# Patient Record
Sex: Male | Born: 1965 | Race: White | Hispanic: No | Marital: Single | Smoking: Current every day smoker
Health system: Southern US, Community
[De-identification: ages and names within clinical notes are randomized; demographics above are authoritative.]

## PROBLEM LIST (undated history)

## (undated) DIAGNOSIS — B1920 Unspecified viral hepatitis C without hepatic coma: Secondary | ICD-10-CM

## (undated) DIAGNOSIS — F319 Bipolar disorder, unspecified: Secondary | ICD-10-CM

## (undated) DIAGNOSIS — F329 Major depressive disorder, single episode, unspecified: Secondary | ICD-10-CM

## (undated) DIAGNOSIS — K859 Acute pancreatitis without necrosis or infection, unspecified: Secondary | ICD-10-CM

## (undated) DIAGNOSIS — F32A Depression, unspecified: Secondary | ICD-10-CM

## (undated) DIAGNOSIS — F419 Anxiety disorder, unspecified: Secondary | ICD-10-CM

## (undated) HISTORY — DX: Anxiety disorder, unspecified: F41.9

## (undated) HISTORY — DX: Major depressive disorder, single episode, unspecified: F32.9

## (undated) HISTORY — DX: Depression, unspecified: F32.A

---

## 2006-11-03 ENCOUNTER — Emergency Department (HOSPITAL_COMMUNITY): Admission: EM | Admit: 2006-11-03 | Discharge: 2006-11-03 | Payer: Self-pay | Admitting: Emergency Medicine

## 2007-06-29 ENCOUNTER — Ambulatory Visit (HOSPITAL_COMMUNITY): Admission: RE | Admit: 2007-06-29 | Discharge: 2007-06-29 | Payer: Self-pay | Admitting: Family Medicine

## 2007-06-30 ENCOUNTER — Inpatient Hospital Stay (HOSPITAL_COMMUNITY): Admission: EM | Admit: 2007-06-30 | Discharge: 2007-07-03 | Payer: Self-pay | Admitting: Emergency Medicine

## 2011-01-22 NOTE — H&P (Signed)
NAME:  Luke Harper, Luke Harper              ACCOUNT NO.:  [id number]   MEDICAL RECORD NO.:  [id number]          PATIENT TYPE:  INP   LOCATION:  5735                         FACILITY:  [facility]   PHYSICIAN:  Thomas A. Cornett, M.D.DATE OF BIRTH:  03-Feb-1966   DATE OF ADMISSION:  06/30/2007  DATE OF DISCHARGE:                              HISTORY & PHYSICAL   FAMILY DOCTOR:  Dr. Tally Joe   SURGEON:  Dr. Luisa Hart   CHIEF COMPLAINT:  Pain in right buttock.   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  Since then, the pain has worsened in the  past 24 hours, has begun to swell and worsen even more.  He had an MRI  yesterday which showed no abscess, however his right buttock has gotten  bigger and continuously gotten worse today.  The patient's thermometer  is not accurate at home; therefore, he has not taken his temperature.  However, he does state that he has had the sweats for the past couple  days.  His white count has increased today to 22,900.  At the doctor's  office yesterday, he got one shot of Rocephin and then today he has  received another shot of Rocephin.  At that time, he was sent over to  [facility] for [place] to see.   PAST HISTORY:  He had a knee arthroscopy 24+ years ago.   FAMILY HISTORY:  Noncontributory.   SOCIAL HISTORY:  He smokes one pack of cigarettes a day and  approximately one pint of alcohol each week.   DRUG ALLERGIES:  NKDA.   MEDICATIONS:  None.   REVIEW OF SYSTEMS:  Pain in the right buttock.  See HPI for other  details, otherwise his review of systems is negative.   PHYSICAL EXAMINATION:  GENERAL:  This is a 45 year old white male who is  febrile with some sweats and is in mild distress.  VITAL SIGNS:  Temperature 102.7, pulse 140 beats per minute,  respirations 18 and blood pressure is 147/94.  HEENT:  Normocephalic, atraumatic.   Sclerae non-injected.  NECK:  Supple.  No lymphadenopathy.  CHEST:  Clear to auscultation bilaterally.  HEART:  Regular rate and rhythm.  No murmurs, gallops, rubs.  ABDOMEN:  Benign with positive bowel sounds and nontender.  EXTREMITIES:  Right buttock with edema, increased fluctuance on the  lateral aspect of the right buttock, no redness is present, however  there is a diffuse amount of warmth noted on the right buttock, no  definite drainage point is noted either.  SKIN:  Normal with no rashes, sores noted.  NEUROLOGIC:  Normal sensation with 2+ strength in all four extremities  with a normal gait.   LABS:  White blood cells 22,900 with a left shift and neutrophils of  81%.  Hemoglobin is 17.3, hematocrit is 49.6 with platelet count of  269,000.  At this point, a BMP is pending.   IMPRESSION:  1. Gluteal abscess of right buttock.  2.  Fever.  3. Significant leukocytosis.   PLAN:  1. Admit to [facility] medical doctor for possible      incision and drainage.  2. Blood cultures to be done before antibiotics are started due to his      increase in fever and white blood count over the past day.  3. NPO.  4. IV pain medicines along with Toradol to help control his fever.  5. Any further recommendations per Dr. Luisa Hart once he comes in and      sees the patient.      Letha Cape, PA      Maisie Fus A. Cornett, M.D.  Electronically Signed    KEO/MEDQ  D:  06/30/2007  T:  07/01/2007  Job:  [id number]

## 2011-01-22 NOTE — Op Note (Signed)
NAME:  Luke Harper, Luke Harper              ACCOUNT NO.:  [id number]   MEDICAL RECORD NO.:  [id number]          PATIENT TYPE:  INP   LOCATION:  5735                         FACILITY:  [facility]   PHYSICIAN:  Adolph Pollack, M.D.DATE OF BIRTH:  Jul 24, 1966   DATE OF PROCEDURE:  06/30/2007  DATE OF DISCHARGE:                               OPERATIVE REPORT   PREOPERATIVE DIAGNOSIS:  Right gluteal abscess.   POSTOPERATIVE DIAGNOSIS:  Right gluteal abscess.   PROCEDURE:  Complex incision and drainage, right gluteal abscess.   SURGEON:  Adolph Pollack, M.D.   ANESTHESIA:  General.   INDICATION:  Mr. Luke Harper is a 45 year old male who apparently rolled over  on a tool last Thursday and felt a pain in his right buttock.  Since  that time, the pain has progressively worsened.  He sought attention  yesterday and has he had noted some swelling.  He was given a shot of  Rocephin by his primary care physician and an ultrasound demonstrated a  small fluid collection thought to be hematoma.  However, he began having  increasing fever with increasing size of the swollen area, feeling worse  and having sweats.  He was sent for CT scan.  This apparently showed a  right gluteal abscess and now he has been sent to the emergency  department for evaluation and is now brought to the operating room for  incision and drainage.   Both Dr. Luisa Hart and I have seen the patient and discussed the procedure  and risks with him extensively.  Risks include but not limited to  bleeding, anesthesia, residual infection, need for extensive procedures  including a very large open wound, possibly flap closure eventually or  skin grafting.   TECHNIQUE:  He was seen in the holding area and the right buttock area  marked my initials.  Was then brought to the operating room and while  supine on the stretcher given a general anesthetic.  He was then placed  prone on the operating table with appropriate padding performed.  The  buttocks area were sterilely prepped and draped.  An examination  demonstrated no fluctuant area but firm induration medially, laterally  and centrally.  Using a 19 gauge needle, I initially started medially  near the gluteal cleft and inserted needle with aspiration and no  purulent material was noted.  I likewise did this laterally.  I then  went centrally, inserted the needle centrally in the mid buttock and got  10 mL of purulent material.  I could feel the induration medially toward  the cleft and as I milked this more purulent material came out.  I then  took a knife and made an incision around the area of the needle.  I  removed and the syringe and needle and used a Kelly clamp to enter the  abscess cavity and placed to suction and evacuated some more purulent  material.  I then performed a finger exam and there was a fairly well  defined the cavity going all the way toward the gluteal cleft medially  about 12 cm.  I could not find  any other obvious cavities at this time.  I sent the fluid for Gram stain and culture.   Following this I took a 1/2 inch Penrose drain and inserted it all the  way into the cavity 12 cm.  I then did took a nylon suture, sewed it to  the skin and sewed a safety pin with the suture.  The safety pin was  then placed through the drain and drained cut off.  A bulky dressing was  applied.   He tolerated the procedure without apparent complications and was taken  to recovery in satisfactory condition.  The plan will be to slowly inch  the drain out over time as long as this controlled the infection.  He  will be placed on IV antibiotics.  If this does not control the  infection and he will need reoperation.  He is well aware of this.      Adolph Pollack, M.D.  Electronically Signed     TJR/MEDQ  D:  06/30/2007  T:  07/01/2007  Job:  [id number]   cc:   Tally Joe, M.D.

## 2011-01-22 NOTE — Discharge Summary (Signed)
NAME:  Luke Harper, JEFFREY              ACCOUNT NO.:  [id number]   MEDICAL RECORD NO.:  [id number]          PATIENT TYPE:  INP   LOCATION:  5736                         FACILITY:  [facility]   PHYSICIAN:  Thomas A. Cornett, M.D.DATE OF BIRTH:  17-Jan-1966   DATE OF ADMISSION:  06/30/2007  DATE OF DISCHARGE:  07/03/2007                               DISCHARGE SUMMARY   DISCHARGING PHYSICIAN:  Dr. Luisa Hart.   OPERATING PHYSICIAN:  Dr. Abbey Chatters.   PRIMARY CARE PHYSICIAN:  Dr. Tally Joe.   CHIEF COMPLAINT/REASON FOR ADMISSION:  Mr. Luke Harper is a 45 year old male  patient who works as a Nutritional therapist.  The Thursday prior to admission the  patient was at work and rolled over on a tool and felt an instant  piercing sensation in his buttock.  Since that time the patient has had  increasing pain and swelling in the right gluteal area.  He presented to  his primary care physician 24 hours prior to admission and was felt to  have a developing abscess.  He was treated with IM Rocephin.  He had an  elevated white count at that time of around 18,000.  He had worsening  pain over the 24 hours since then.  Went back to see  Dr. Azucena Cecil.  Received another injection of Rocephin.  He had had prior  imaging of that area which showed no definite abscess, just edematous  process.  Because of increasing pain and white count the patient was  sent to [facility] ER for surgical evaluation.   On admission the patient was quite febrile, temperature 102.7, and  tachycardic with a heart rate of 140.  On exam his right buttock was  extremely edematous with induration near the perirectal portion of this.  The edema actually spread throughout the entire buttock region becoming  more fluctuant as it tracks laterally towards the right lateral hip.  Again  this area is exquisitely tender.  There are no obvious areas of  redness or drainage.  There is a tiny bruise-like area in the buttock  region.  This process does not involve the  rectum or the perirectal  region directly.  The patient was admitted with following diagnoses:   DIAGNOSES:  1. Gluteal abscess right buttock.  Rule out necrotizing process.  2. Fever and leukocytosis.   HOSPITAL COURSE:  The patient was admitted prior to receiving  antibiotics.  Blood cultures were obtained.  He was started empirically  on Unasyn and vancomycin.  He was subsequently taken from the ER to the  OR by Dr. Abbey Chatters where he underwent an ultrasound-guided drainage of  a deep gluteal abscess tract that tracked down towards the coccyx bone.  Please review Dr. Maris Berger operative note for specific details.  The  wound was left open with a wide Penrose drain in place deep into the  wound tract and sutured into place.   Over the next several days the patient continued to improve.  Initially  he did not have much drainage out of the wound, and by postop day #2 the  patient was having a significant amount of thick reddish  brown nonfoul-  smelling drainage per the wound, but, otherwise, the wound had improved  greatly.  Marked decrease in edema, induration, and fluctuance, and by  the date of discharge essentially nontender.  By postop day #2 the  patient's white count had decreased to 11,500, and he was essentially  afebrile.  He was spiking some low grade temperatures of 100.9 24 hours  prior to discharge.   Subsequent wound cultures were positive for staph aureus resistant to  penicillin and Septra and sensitive to tetracycline and Levaquin.  The  patient was subsequently discharged home on doxycycline for seven days  with plans to follow up with Dr. Abbey Chatters, the operative physician, on  Monday, October 27.  The Penrose drain was left in place because of  significant drain output, and patient has been instructed on how to  shower t.i.d. and reinforce dressings around the drain to minimize  soilage of clothing.   DISCHARGE DIAGNOSES:  1. Deep tracking right gluteal  abscess without evidence of necrotizing      process.  2. Status post incision and drainage of right gluteal abscess.  3. Cultures positive for Staphylococcus aureus resistant to penicillin      and Septra.   DISCHARGE MEDICATIONS:  The patient will resume the following home  medication:   1. Cymbalta 60 mg daily.  2. New medications include Percocet 5/325 1-2 tabs every four hours as      needed for pain, over-the-counter ibuprofen 2-3 tabs every eight      hours as needed for pain in addition to Percocet.  Please take with      food.  Doxycycline 100 mg b.i.d. for seven days.  3. The patient was also complaining of obstipation issues before      discharge.  Therefore he was given Dulcolax tabs two as well as a      dose of MiraLax before discharge, and he was also started on these      over-the-counter medications:  MiraLax 17 g in 8 oz of fluid daily      while constipated, over-the-counter docusate sodium one twice daily      for constipation.   Return to work to be determined by Dr. Abbey Chatters.  I have given the  patient a note that keeps him out of work for at least two weeks at this  point.   DIET:  No restrictions.   WOUND CARE:  Shower three times daily, dry packing or adult diaper to  manage wound drainage.   ACTIVITY:  Increase activity slowly.  May walk up steps, shower as  noted, no driving while taking the Percocet.   FOLLOW-UP APPOINTMENTS:  He has an appointment to see Dr. Abbey Chatters at  9:15 on Monday, October 27.      Allison L. Rennis Harding, N.P.      Thomas A. Cornett, M.D.  Electronically Signed    ALE/MEDQ  D:  07/03/2007  T:  07/04/2007  Job:  [id number]   cc:   Adolph Pollack, M.D.  Tally Joe, M.D.

## 2011-04-04 ENCOUNTER — Other Ambulatory Visit: Payer: Self-pay | Admitting: Gastroenterology

## 2011-04-04 ENCOUNTER — Ambulatory Visit (INDEPENDENT_AMBULATORY_CARE_PROVIDER_SITE_OTHER): Payer: 59 | Admitting: Gastroenterology

## 2011-04-04 VITALS — BP 99/72 | HR 61 | Temp 97.4°F | Ht 69.0 in | Wt 209.0 lb

## 2011-04-04 DIAGNOSIS — B182 Chronic viral hepatitis C: Secondary | ICD-10-CM

## 2011-04-05 LAB — HEPATITIS A ANTIBODY, TOTAL: Hep A Total Ab: NEGATIVE

## 2011-04-05 LAB — COMPREHENSIVE METABOLIC PANEL
Albumin: 4.1 g/dL (ref 3.5–5.2)
Alkaline Phosphatase: 82 U/L (ref 39–117)
BUN: 16 mg/dL (ref 6–23)
Calcium: 9.4 mg/dL (ref 8.4–10.5)
Chloride: 101 mEq/L (ref 96–112)
Potassium: 4.6 mEq/L (ref 3.5–5.3)
Sodium: 141 mEq/L (ref 135–145)
Total Bilirubin: 0.4 mg/dL (ref 0.3–1.2)

## 2011-04-05 LAB — CBC WITH DIFFERENTIAL/PLATELET
Basophils Absolute: 0 10*3/uL (ref 0.0–0.1)
Eosinophils Absolute: 0.1 10*3/uL (ref 0.0–0.7)
Eosinophils Relative: 2 % (ref 0–5)
Hemoglobin: 16.1 g/dL (ref 13.0–17.0)
Lymphs Abs: 3.3 10*3/uL (ref 0.7–4.0)
Monocytes Relative: 9 % (ref 3–12)
Neutrophils Relative %: 50 % (ref 43–77)
Platelets: 258 10*3/uL (ref 150–400)
RDW: 13.7 % (ref 11.5–15.5)

## 2011-04-05 LAB — IRON AND TIBC
Iron: 120 ug/dL (ref 42–165)
UIBC: 162 ug/dL

## 2011-04-05 LAB — HEPATITIS B CORE ANTIBODY, TOTAL: Hep B Core Total Ab: NEGATIVE

## 2011-04-05 LAB — TSH: TSH: 2.041 u[IU]/mL (ref 0.350–4.500)

## 2011-04-05 LAB — FERRITIN: Ferritin: 769 ng/mL — ABNORMAL HIGH (ref 22–322)

## 2011-04-11 NOTE — Progress Notes (Addendum)
NAME:  Luke Harper, Luke Harper    MR#:  [id number]      DATE:  04/04/2011  DOB:  [birth year]    cc: Consulting Physician:  Eugenio Hoes. Book, MD, [address] [place], [place], [address], Phone [phone], Fax [phone] Primary care physician:  Same Referring Physician:  Elias Else, MD, [facility], [address], [place], [place] [address], Fax [phone]    Reason for referral:  Positive hepatitis C antibody.   HISTORY OF PRESENT ILLNESS:  The patient is a 45 year old gentleman who I have been asked to see in consultation by Dr. Nicholos Johns regarding a positive hepatitis C antibody.  According to his wife, in 2011 his liver enzymes were normal. In fact on 02/28/2010, his ALT was 40, which although by lab criteria was normal, it is in fact near the upper limits of normal. The patient  reports undergoing a routine physical at his place of employment, which the [employer] and being told he had abnormal liver tests. He was sent to Dr. Nicholos Johns to follow up on this. On 12/11/2010,  his ALT was 281 with an AST of 142 and ALP of 96. Total bilirubin 0.6 and the albumin was 3.9. This led to the finding of a positive hepatitis C antibody on 12/11/2010. His hepatitis B surface antigen  was negative. His HCV RNA on 01/15/2011, was 750,860 international units per mL.  He was not genotyped.  There is no history to suggest decompensated or cryoglobulin mediated disease.  With respect to risk factors for liver disease, he drank a fifth a week until approximately 8 months ago when he stopped. There is no history of DWIs. 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 He is attending counseling through [facility] in [place] once a week for the last 5 months  and sees a psychiatrist, Dr. Carver Fila, every 3 months. He has tattoos but no history of unsterile body piercing or blood transfusions.   FAMILY HISTORY:  Significant  for father who had alcohol-induced cirrhosis. He believes he has been vaccinated hepatitis B at work but not hepatitis A.   PAST MEDICAL HISTORY:  Otherwise unremarkable.    Past surgical history:  He developed right buttock staph infection requiring drainage. Twenty years ago he had surgery for right knee.    Past psychiatric history:  Six years ago he was hospitalized for depression but there is no history of suicide attempt. He see a psychiatrist Dr. Carver Fila every 3 months. In addition to being seen in counseling for his drug use  through [facility]. He is managed with antidepressants. The patient was on Cymbalta in the past but this was discontinued because of his liver disease.   CURRENT MEDICATIONS:  Wellbutrin 150 mg p.o. b.i.d., sertraline mg p.o. daily, Suboxone 8 mg p.o. daily.    Allergies:  Denies.    Habits:  Smoking, 1-2 pack cigarettes per day. Alcohol as above.   FAMILY HISTORY:  As above.   SOCIAL HISTORY:  Married and accompanied by his wife today. He has one 7 year old daughter. He works for the [employer]. This  primarily means working Monday through Friday with weekends on-call but rarely called.   REVIEW OF SYSTEMS:  All 10 systems reviewed today on the review of systems form, which was signed and placed in the chart. His CES-D was 32.   PHYSICAL EXAMINATION:  Constitutional: Well appearing without stigmata of chronic  liver disease or peripheral wasting. Vital signs: Height 69 inches, weight 209 pounds, blood pressure 99/72, pulse is 61, temperature 97.4  Fahrenheit. Ears, nose, mouth and throat:  Unremarkable oropharynx.  No thyromegaly or neck masses.  Chest:  Resonant to percussion.  Clear to auscultation.  Cardiovascular:  Heart sounds normal S1, S2 without  murmurs or rubs.  There is no peripheral edema.  Abdominal:  Normal bowel sounds.  No masses or tenderness.  I could not appreciate a liver edge or spleen tip.  I  could not appreciate any hernias.   Lymphatics:  No cervical or inguinal lymphadenopathy.  Central Nervous System:  No asterixis or focal neurologic findings.  Dermatologic:  Anicteric without palmar erythema or spider angiomata.  Eyes:  Anicteric sclerae.  Pupils are equal and reactive to light.   LABORATORY STUDIES:  From previous as mentioned above.   ASSESSMENT:  The patient is a 45 year old gentleman with history of a positive HCV RNA and C antibody. He appears to be clinically and biochemically well compensated. My greatest concern is that of his history of depression.  I would like to have the opinion of his therapist through [facility] and his psychiatrist, Dr. Zenon Mayo, opinion regarding his fitness to tolerate therapy with interferon in addition to his compliance with follow up.  I discussed the nature and natural history of hepatitis C with the patient and his wife. We discussed the significance of genotyping him. We discussed the role of biopsy in genotype 1 patients. I discussed  treatment with pegylated interferon, ribavirin for all genotypes and protease inhibitors in addition to pegylated interferon for genotype 1 patients. I reviewed the specific system, constitutional, and  psychiatric side effects of therapy, emphasizing the psychiatric side effects of therapy because of his history. I reviewed the response rates and our treatment protocol for our clinic. Given the fact he has  a history of depression, I asked if he would discuss treatment with his therapist and psychiatrist regarding his fitness to tolerate therapy and his compliance with treatment. I have asked him to tell his mental health providers to communicate with me. We also discussed the risk of contagion.  I discussed the possibility of participating in clinical trials, although his psychiatric history may preclude him from doing so. I explained to him this is not considered standard of care, and the    complete risks and benefits are not known. He is willing to take a phone call from our research coordinator should he be eligible.   Plan:  1. Genotype. 2. Standard labs otherwise. 3. I would like, by way of this note, to hear from his therapist and Dr. Carver Fila regarding his fitness to tolerate therapy from a psychiatric standpoint, in addition to his compliance with follow up with his appointments at [facility]. Any documentation can be faxed to [phone]. 4. If genotype 1, will biopsy him in anticipation of trying to treat him and then follow up thereafter in clinic. 5. If genotype 2 or 3, follow up directly without biopsy. 6. I will pass on his information to our research coordinators to see if he is eligible for any protocols. 7. Literature on hepatitis C given to the patient and his wife, in addition to web sites relevant to hepatitis C.             Brooke Dare, MD    ADDENDUM:  Genotype 1a.  Will ask for biopsy.  Hep A and B nave.  [id number] .[id number]  D:  Thu Jul 26 19:24:36 2012 ; T:  Thu Jul 26 21:12:28 2012  Job #:  [id number]

## 2011-06-19 LAB — CBC
HCT: 40.7
Hemoglobin: 13.7
MCV: 94.9
Platelets: 245
RBC: 4.22
RBC: 4.31
RDW: 15.7 — ABNORMAL HIGH

## 2011-06-19 LAB — BASIC METABOLIC PANEL
BUN: 9
CO2: 29
Chloride: 103
Chloride: 95 — ABNORMAL LOW
Creatinine, Ser: 0.93
Creatinine, Ser: 1.09
GFR calc Af Amer: >60
GFR calc Af Amer: >60
GFR calc non Af Amer: >60
Glucose, Bld: 108 — ABNORMAL HIGH
Glucose, Bld: 119 — ABNORMAL HIGH
Potassium: 3.5
Potassium: 4.1
Sodium: 132 — ABNORMAL LOW
Sodium: 137

## 2011-06-19 LAB — ANAEROBIC CULTURE

## 2011-06-19 LAB — CULTURE, ROUTINE-ABSCESS

## 2011-06-19 LAB — CULTURE, BLOOD (ROUTINE X 2)

## 2011-11-21 ENCOUNTER — Telehealth: Payer: Self-pay | Admitting: Gastroenterology

## 2011-11-21 NOTE — Telephone Encounter (Signed)
Luke Harper called to ask about the status of his HCV therapy. 1) I do not see that the biopsy I ordered was ever done 2) I never heard from Dr Carver Fila, though  I copied him on the note  I called and left a message on his answering machine that I never heard from Dr Carver Fila and that he can call back to the office.

## 2011-11-25 ENCOUNTER — Telehealth: Payer: Self-pay

## 2011-12-05 ENCOUNTER — Telehealth: Payer: Self-pay

## 2012-05-10 ENCOUNTER — Ambulatory Visit (INDEPENDENT_AMBULATORY_CARE_PROVIDER_SITE_OTHER): Payer: 59 | Admitting: Emergency Medicine

## 2012-05-10 VITALS — BP 110/72 | HR 66 | Temp 98.4°F | Resp 18 | Ht 70.5 in | Wt 207.0 lb

## 2012-05-10 DIAGNOSIS — S335XXA Sprain of ligaments of lumbar spine, initial encounter: Secondary | ICD-10-CM

## 2012-05-10 MED ORDER — CYCLOBENZAPRINE HCL 10 MG PO TABS: 10.0000 mg | ORAL_TABLET | Freq: Three times a day (TID) | ORAL | Status: AC | PRN

## 2012-05-10 MED ORDER — NAPROXEN SODIUM 550 MG PO TABS: 550.0000 mg | ORAL_TABLET | Freq: Two times a day (BID) | ORAL | Status: DC

## 2012-05-10 NOTE — Progress Notes (Signed)
   Date:  05/10/2012   Name:  Luke Harper   DOB:  [birth year]   MRN:  [id number] Gender: male Age: 46 y.o.  PCP:  No primary provider on file.    Chief Complaint: Back Pain   History of Present Illness:  Luke Harper is a 46 y.o. pleasant patient who presents with the following:  Bent over last night to pick up a pillow on the floor and was unable to get back up.  Experiencing low back pain since. Nonradiating, no neuro symptoms.  Pain located in left lower back.  No improvement since last night.  Worse when he moves about.  History of one prior injury after digging up a water meter.  There is no problem list on file for this patient.   No past medical history on file.  No past surgical history on file.  History  Substance Use Topics  . Smoking status: Current Everyday Smoker -- 2.0 packs/day for 20 years    Types: Cigarettes  . Smokeless tobacco: Not on file  . Alcohol Use: Not on file    No family history on file.  No Known Allergies  Medication list has been reviewed and updated.  No current outpatient prescriptions on file prior to visit.    Review of Systems:  As per HPI, otherwise negative.    Physical Examination: Filed Vitals:   05/10/12 1706  BP: 110/72  Pulse: 66  Temp: 98.4 F (36.9 C)  Resp: 18   Filed Vitals:   05/10/12 1706  Height: 5' 10.5" (1.791 m)  Weight: 207 lb (93.895 kg)   Body mass index is 29.28 kg/(m^2). Ideal Body Weight: Weight in (lb) to have BMI = 25: 176.4    GEN: WDWN, NAD, Non-toxic, Alert & Oriented x 3 HEENT: Atraumatic, Normocephalic.  Ears and Nose: No external deformity. EXTR: No clubbing/cyanosis/edema Back:  Tender left lumbar paravertebral muscles.   NEURO: Normal gait. Neuro grossly intact DTR's symmetric bilaterally PSYCH: Normally interactive. Conversant. Not depressed or anxious appearing.  Calm demeanor.    Assessment and Plan: Lumbar strain Anaprox Flexeril Follow up as needed  Carmelina Dane, MD

## 2012-07-28 ENCOUNTER — Encounter (HOSPITAL_COMMUNITY): Payer: Self-pay | Admitting: Emergency Medicine

## 2012-07-28 ENCOUNTER — Emergency Department (HOSPITAL_COMMUNITY): Payer: 59

## 2012-07-28 ENCOUNTER — Emergency Department (HOSPITAL_COMMUNITY)
Admission: EM | Admit: 2012-07-28 | Discharge: 2012-07-28 | Disposition: A | Discharge status: Home / Self Care | Payer: 59 | Attending: Emergency Medicine | Admitting: Emergency Medicine

## 2012-07-28 DIAGNOSIS — S0003XA Contusion of scalp, initial encounter: Secondary | ICD-10-CM | POA: Insufficient documentation

## 2012-07-28 DIAGNOSIS — Y9389 Activity, other specified: Secondary | ICD-10-CM | POA: Insufficient documentation

## 2012-07-28 DIAGNOSIS — S161XXA Strain of muscle, fascia and tendon at neck level, initial encounter: Secondary | ICD-10-CM

## 2012-07-28 DIAGNOSIS — F17200 Nicotine dependence, unspecified, uncomplicated: Secondary | ICD-10-CM | POA: Insufficient documentation

## 2012-07-28 DIAGNOSIS — Z79899 Other long term (current) drug therapy: Secondary | ICD-10-CM | POA: Insufficient documentation

## 2012-07-28 DIAGNOSIS — S139XXA Sprain of joints and ligaments of unspecified parts of neck, initial encounter: Secondary | ICD-10-CM | POA: Insufficient documentation

## 2012-07-28 DIAGNOSIS — R040 Epistaxis: Secondary | ICD-10-CM | POA: Insufficient documentation

## 2012-07-28 DIAGNOSIS — S0033XA Contusion of nose, initial encounter: Secondary | ICD-10-CM

## 2012-07-28 DIAGNOSIS — S0083XA Contusion of other part of head, initial encounter: Secondary | ICD-10-CM | POA: Insufficient documentation

## 2012-07-28 MED ORDER — CYCLOBENZAPRINE HCL 10 MG PO TABS: 10.0000 mg | ORAL_TABLET | Freq: Three times a day (TID) | ORAL | Status: DC | PRN

## 2012-07-28 MED ORDER — HYDROCODONE-ACETAMINOPHEN 5-325 MG PO TABS: 1.0000 | ORAL_TABLET | Freq: Four times a day (QID) | ORAL | Status: DC | PRN

## 2012-07-28 MED ORDER — IBUPROFEN 800 MG PO TABS: 800.0000 mg | ORAL_TABLET | Freq: Three times a day (TID) | ORAL | Status: DC | PRN
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
## 2012-07-28 NOTE — ED Provider Notes (Signed)
Medical screening examination/treatment/procedure(s) were performed by non-physician practitioner and as supervising physician I was immediately available for consultation/collaboration.   Shuntell Foody, MD 07/28/12 2217 

## 2012-10-01 ENCOUNTER — Other Ambulatory Visit (HOSPITAL_COMMUNITY): Payer: Self-pay | Admitting: Internal Medicine

## 2012-10-01 DIAGNOSIS — B1920 Unspecified viral hepatitis C without hepatic coma: Secondary | ICD-10-CM

## 2012-10-08 ENCOUNTER — Other Ambulatory Visit: Payer: Self-pay | Admitting: Radiology

## 2012-10-14 ENCOUNTER — Other Ambulatory Visit: Payer: Self-pay | Admitting: Radiology

## 2012-10-15 ENCOUNTER — Other Ambulatory Visit: Payer: Self-pay | Admitting: Radiology

## 2012-10-16 ENCOUNTER — Ambulatory Visit (HOSPITAL_COMMUNITY)
Admission: RE | Admit: 2012-10-16 | Discharge: 2012-10-16 | Disposition: A | Discharge status: Home / Self Care | Payer: 59 | Source: Ambulatory Visit | Attending: Internal Medicine | Admitting: Internal Medicine

## 2012-10-16 DIAGNOSIS — B1920 Unspecified viral hepatitis C without hepatic coma: Secondary | ICD-10-CM

## 2012-10-16 DIAGNOSIS — K7689 Other specified diseases of liver: Secondary | ICD-10-CM | POA: Insufficient documentation

## 2012-10-16 LAB — CBC
HCT: 45 % (ref 39.0–52.0)
Hemoglobin: 15.9 g/dL (ref 13.0–17.0)
MCH: 31 pg (ref 26.0–34.0)
MCHC: 35.3 g/dL (ref 30.0–36.0)
MCV: 87.7 fL (ref 78.0–100.0)
Platelets: 207 K/uL (ref 150–400)
RBC: 5.13 MIL/uL (ref 4.22–5.81)
RDW: 13.8 % (ref 11.5–15.5)
WBC: 9.7 K/uL (ref 4.0–10.5)

## 2012-10-16 LAB — PROTIME-INR
INR: 0.87 (ref 0.00–1.49)
Prothrombin Time: 11.8 s (ref 11.6–15.2)

## 2012-10-16 LAB — APTT: aPTT: 31 s (ref 24–37)

## 2012-10-16 MED ORDER — MIDAZOLAM HCL 2 MG/2ML IJ SOLN
INTRAMUSCULAR | Status: AC
  Filled 2012-10-16: qty 4

## 2012-10-16 MED ORDER — FENTANYL CITRATE 0.05 MG/ML IJ SOLN
INTRAMUSCULAR | Status: AC
  Filled 2012-10-16: qty 4

## 2012-10-16 MED ORDER — SODIUM CHLORIDE 0.9 % IV SOLN
Freq: Once | INTRAVENOUS | Status: AC
  Administered 2012-10-16: 10 mL/h via INTRAVENOUS

## 2012-10-16 MED ORDER — FENTANYL CITRATE 0.05 MG/ML IJ SOLN
INTRAMUSCULAR | Status: DC | PRN
  Administered 2012-10-16 (×2): 50 ug via INTRAVENOUS

## 2012-10-16 MED ORDER — MIDAZOLAM HCL 2 MG/2ML IJ SOLN
INTRAMUSCULAR | Status: DC | PRN
  Administered 2012-10-16: 2 mg via INTRAVENOUS

## 2012-10-16 NOTE — ED Notes (Signed)
Notified SS for bed

## 2012-10-16 NOTE — H&P (Signed)
Luke Harper is an 47 y.o. male.   Chief Complaint: "I'm here for a liver biopsy"  HPI: Patient with history of hepatitis C presents today for US guided random liver biopsy.  PMH: depression,  hepatitis C; denies HTN,DM,CAD,COPD, cancer PSH: I&D of right buttock abscess; rt knee surgery No family history on file. Social History:  reports that he has been smoking Cigarettes.  He has a 40 pack-year smoking history. He does not have any smokeless tobacco history on file. His alcohol and drug histories not on file.  Allergies: No Known Allergies  Current outpatient prescriptions:Multiple Vitamin (MULTIVITAMIN WITH MINERALS) TABS, Take 1 tablet by mouth daily., Disp: , Rfl: ;  Vilazodone HCl (VIIBRYD) 40 MG TABS, Take 40 mg by mouth daily., Disp: , Rfl:  Current facility-administered medications:0.9 %  sodium chloride infusion, , Intravenous, Once, Robet Leu, PA;  fentaNYL (SUBLIMAZE) 0.05 MG/ML injection, , , , ;  midazolam (VERSED) 2 MG/2ML injection, , , ,   Results for orders placed in visit on 04/04/11  IRON AND TIBC      Component Value Range   Iron 120  42 - 165 ug/dL   UIBC 109     TIBC 323  215 - 435 ug/dL   %SAT 43  20 - 55 %  CBC WITH DIFFERENTIAL      Component Value Range   WBC 8.5  4.0 - 10.5 K/uL   RBC 5.16  4.22 - 5.81 MIL/uL   Hemoglobin 16.1  13.0 - 17.0 g/dL   HCT 55.7  32.2 - 02.5 %   MCV 90.3  78.0 - 100.0 fL   MCH 31.2  26.0 - 34.0 pg   MCHC 34.5  30.0 - 36.0 g/dL   RDW 42.7  06.2 - 37.6 %   Platelets 258  150 - 400 K/uL   Neutrophils Relative 50  43 - 77 %   Neutro Abs 4.3  1.7 - 7.7 K/uL   Lymphocytes Relative 38  12 - 46 %   Lymphs Abs 3.3  0.7 - 4.0 K/uL   Monocytes Relative 9  3 - 12 %   Monocytes Absolute 0.8  0.1 - 1.0 K/uL   Eosinophils Relative 2  0 - 5 %   Eosinophils Absolute 0.1  0.0 - 0.7 K/uL   Basophils Relative 0  0 - 1 %   Basophils Absolute 0.0  0.0 - 0.1 K/uL   Smear Review Criteria for review not met    PROTIME-INR   Component Value Range   Prothrombin Time 11.8  11.6 - 15.2 seconds   INR 0.83  <1.50  COMPREHENSIVE METABOLIC PANEL      Component Value Range   Sodium 141  135 - 145 mEq/L   Potassium 4.6  3.5 - 5.3 mEq/L   Chloride 101  96 - 112 mEq/L   CO2 29  19 - 32 mEq/L   Glucose, Bld 78  70 - 99 mg/dL   BUN 16  6 - 23 mg/dL   Creat 2.83  1.51 - 7.61 mg/dL   Total Bilirubin 0.4  0.3 - 1.2 mg/dL   Alkaline Phosphatase 82  39 - 117 U/L   AST 44 (*) 0 - 37 U/L   ALT 60 (*) 0 - 53 U/L   Total Protein 6.4  6.0 - 8.3 g/dL   Albumin 4.1  3.5 - 5.2 g/dL   Calcium 9.4  8.4 - 60.7 mg/dL  TSH      Component  Value Range   TSH 2.041  0.350 - 4.500 uIU/mL  FERRITIN      Component Value Range   Ferritin 769 (*) 22 - 322 ng/mL  HEPATITIS B CORE ANTIBODY, TOTAL      Component Value Range   Hep B Core Total Ab NEG  NEGATIVE  HEPATITIS B SURFACE ANTIBODY      Component Value Range   Hep B S Ab NEG  NEGATIVE  HEPATITIS A ANTIBODY, TOTAL      Component Value Range   Hep A Total Ab NEG  NEGATIVE  AFP TUMOR MARKER      Component Value Range   AFP-Tumor Marker 1.7  0.0 - 8.0 ng/mL  HEPATITIS C GENOTYPE      Component Value Range   HCV Genotype 1a     10/16/12 labs pending Review of Systems  Constitutional: Negative for fever and chills.  Respiratory: Positive for cough. Negative for shortness of breath.   Cardiovascular: Negative for chest pain.  Gastrointestinal: Negative for nausea, vomiting and abdominal pain.  Musculoskeletal: Negative for back pain.  Neurological: Negative for headaches.  Endo/Heme/Allergies: Does not bruise/bleed easily.    Blood pressure 113/70, pulse 66, temperature 97.9 F (36.6 C), temperature source Oral, resp. rate 18, height 5\' 10"  (1.778 m), weight 230 lb (104.327 kg), SpO2 98.00%. Physical Exam  Constitutional: He is oriented to person, place, and time. He appears well-developed and well-nourished.  Cardiovascular: Normal rate and regular rhythm.   Respiratory:  Effort normal.       Scatt insp/exp wheezes  GI: Soft. Bowel sounds are normal. There is no tenderness.  Musculoskeletal: Normal range of motion. He exhibits no edema.  Neurological: He is alert and oriented to person, place, and time.     Assessment/Plan Pt with history of hepatitis C. Plan is for US guided random core liver biopsy today. Details/risks of procedure d/w pt/wife with their understanding and consent.  Kandace Elrod,D KEVIN 10/16/2012, 9:55 AM

## 2012-10-16 NOTE — ED Notes (Signed)
Patient denies pain and is resting comfortably.  

## 2012-10-16 NOTE — Procedures (Signed)
Technically successful US guided biopsy of right lobe of the liver.  No immediate complications.  

## 2013-11-07 ENCOUNTER — Emergency Department (HOSPITAL_COMMUNITY)
Admission: EM | Admit: 2013-11-07 | Discharge: 2013-11-07 | Disposition: A | Discharge status: Home / Self Care | Payer: 59 | Attending: Emergency Medicine | Admitting: Emergency Medicine

## 2013-11-07 ENCOUNTER — Encounter (HOSPITAL_COMMUNITY): Payer: Self-pay | Admitting: Emergency Medicine

## 2013-11-07 ENCOUNTER — Emergency Department (HOSPITAL_COMMUNITY): Payer: 59

## 2013-11-07 DIAGNOSIS — IMO0002 Reserved for concepts with insufficient information to code with codable children: Secondary | ICD-10-CM

## 2013-11-07 DIAGNOSIS — Y929 Unspecified place or not applicable: Secondary | ICD-10-CM | POA: Insufficient documentation

## 2013-11-07 DIAGNOSIS — F1110 Opioid abuse, uncomplicated: Secondary | ICD-10-CM | POA: Insufficient documentation

## 2013-11-07 DIAGNOSIS — Y939 Activity, unspecified: Secondary | ICD-10-CM | POA: Insufficient documentation

## 2013-11-07 DIAGNOSIS — F1910 Other psychoactive substance abuse, uncomplicated: Secondary | ICD-10-CM

## 2013-11-07 DIAGNOSIS — S22009A Unspecified fracture of unspecified thoracic vertebra, initial encounter for closed fracture: Secondary | ICD-10-CM | POA: Insufficient documentation

## 2013-11-07 DIAGNOSIS — R079 Chest pain, unspecified: Secondary | ICD-10-CM | POA: Insufficient documentation

## 2013-11-07 DIAGNOSIS — Z79899 Other long term (current) drug therapy: Secondary | ICD-10-CM | POA: Insufficient documentation

## 2013-11-07 DIAGNOSIS — F17200 Nicotine dependence, unspecified, uncomplicated: Secondary | ICD-10-CM | POA: Insufficient documentation

## 2013-11-07 LAB — COMPREHENSIVE METABOLIC PANEL
ALT: 63 U/L — AB (ref 0–53)
AST: 57 U/L — AB (ref 0–37)
Albumin: 4 g/dL (ref 3.5–5.2)
Alkaline Phosphatase: 70 U/L (ref 39–117)
BILIRUBIN TOTAL: 0.5 mg/dL (ref 0.3–1.2)
BUN: 14 mg/dL (ref 6–23)
CHLORIDE: 99 meq/L (ref 96–112)
CO2: 30 meq/L (ref 19–32)
Calcium: 9.7 mg/dL (ref 8.4–10.5)
Creatinine, Ser: 0.93 mg/dL (ref 0.50–1.35)
GFR calc non Af Amer: >90 mL/min (ref >90)
Glucose, Bld: 90 mg/dL (ref 70–99)
Potassium: 4.3 mEq/L (ref 3.7–5.3)
SODIUM: 140 meq/L (ref 137–147)
Total Protein: 7.3 g/dL (ref 6.0–8.3)

## 2013-11-07 LAB — CBC WITH DIFFERENTIAL/PLATELET
BASOS ABS: 0 10*3/uL (ref 0.0–0.1)
Basophils Relative: 0 % (ref 0–1)
Eosinophils Absolute: 0.1 10*3/uL (ref 0.0–0.7)
Eosinophils Relative: 1 % (ref 0–5)
HEMATOCRIT: 50 % (ref 39.0–52.0)
Hemoglobin: 17.6 g/dL — ABNORMAL HIGH (ref 13.0–17.0)
LYMPHS PCT: 14 % (ref 12–46)
Lymphs Abs: 2 10*3/uL (ref 0.7–4.0)
MCH: 31.5 pg (ref 26.0–34.0)
MCHC: 35.2 g/dL (ref 30.0–36.0)
MCV: 89.4 fL (ref 78.0–100.0)
MONO ABS: 1 10*3/uL (ref 0.1–1.0)
Monocytes Relative: 8 % (ref 3–12)
NEUTROS ABS: 10.6 10*3/uL — AB (ref 1.7–7.7)
Neutrophils Relative %: 77 % (ref 43–77)
PLATELETS: 256 10*3/uL (ref 150–400)
RBC: 5.59 MIL/uL (ref 4.22–5.81)
RDW: 12.8 % (ref 11.5–15.5)
WBC: 13.7 10*3/uL — AB (ref 4.0–10.5)

## 2013-11-07 MED ORDER — NALOXONE HCL 0.4 MG/ML IJ SOLN
0.4000 mg | Freq: Once | INTRAMUSCULAR | Status: AC
  Administered 2013-11-07: 0.4 mg via INTRAVENOUS
  Filled 2013-11-07: qty 1

## 2013-11-07 MED ORDER — IOHEXOL 300 MG/ML  SOLN
100.0000 mL | Freq: Once | INTRAMUSCULAR | Status: AC | PRN
Start: 2013-11-07 — End: 2013-11-07
  Administered 2013-11-07: 100 mL via INTRAVENOUS

## 2013-11-07 NOTE — ED Notes (Signed)
Ambulated with patient; slightly unsteady on feet, but able to walk to bathroom, stood to urinate, and ambulated back to bed. Laid down on bed and went back to sleep.
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
## 2013-11-07 NOTE — ED Notes (Signed)
Called CT to notify them that pt now has  IV access.

## 2013-11-07 NOTE — ED Notes (Signed)
[address] [employer] at bedside.

## 2013-11-07 NOTE — ED Notes (Addendum)
Attempted to call sister on two different numbers, no answer. Patient cannot think of anyone else to take him home. [phone]
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
## 2013-11-07 NOTE — ED Notes (Signed)
Able to reach sister; she is on the way.
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
## 2013-11-30 ENCOUNTER — Other Ambulatory Visit: Payer: Self-pay | Admitting: Family Medicine

## 2013-11-30 ENCOUNTER — Other Ambulatory Visit: Payer: Self-pay | Admitting: Sports Medicine

## 2013-11-30 ENCOUNTER — Ambulatory Visit
Admission: RE | Admit: 2013-11-30 | Discharge: 2013-11-30 | Disposition: A | Discharge status: Home / Self Care | Payer: 59 | Source: Ambulatory Visit | Attending: Family Medicine | Admitting: Family Medicine

## 2013-11-30 DIAGNOSIS — R079 Chest pain, unspecified: Secondary | ICD-10-CM

## 2013-11-30 MED ORDER — IOHEXOL 350 MG/ML SOLN
100.0000 mL | Freq: Once | INTRAVENOUS | Status: AC | PRN
  Administered 2013-11-30: 100 mL via INTRAVENOUS

## 2014-01-16 ENCOUNTER — Ambulatory Visit: Payer: 59

## 2014-01-16 ENCOUNTER — Ambulatory Visit (INDEPENDENT_AMBULATORY_CARE_PROVIDER_SITE_OTHER): Payer: 59 | Admitting: Family Medicine

## 2014-01-16 VITALS — BP 124/70 | HR 91 | Temp 98.2°F | Resp 20 | Ht 70.0 in | Wt 208.4 lb

## 2014-01-16 DIAGNOSIS — L02419 Cutaneous abscess of limb, unspecified: Secondary | ICD-10-CM

## 2014-01-16 DIAGNOSIS — F1910 Other psychoactive substance abuse, uncomplicated: Secondary | ICD-10-CM

## 2014-01-16 DIAGNOSIS — L03119 Cellulitis of unspecified part of limb: Secondary | ICD-10-CM

## 2014-01-16 DIAGNOSIS — M25531 Pain in right wrist: Secondary | ICD-10-CM

## 2014-01-16 DIAGNOSIS — M25539 Pain in unspecified wrist: Secondary | ICD-10-CM

## 2014-01-16 LAB — POCT CBC
Granulocyte percent: 73.8 %G (ref 37–80)
HCT, POC: 48.4 % (ref 43.5–53.7)
Hemoglobin: 16.1 g/dL (ref 14.1–18.1)
Lymph, poc: 3.6 — AB (ref 0.6–3.4)
MCH, POC: 31.3 pg — AB (ref 27–31.2)
MCHC: 33.3 g/dL (ref 31.8–35.4)
MCV: 94.1 fL (ref 80–97)
MID (cbc): 1.1 — AB (ref 0–0.9)
MPV: 8.9 fL (ref 0–99.8)
POC Granulocyte: 13.2 — AB (ref 2–6.9)
POC LYMPH PERCENT: 19.9 %L (ref 10–50)
POC MID %: 6.3 %M (ref 0–12)
Platelet Count, POC: 334 10*3/uL (ref 142–424)
RBC: 5.14 M/uL (ref 4.69–6.13)
RDW, POC: 14.8 %
WBC: 17.9 10*3/uL — AB (ref 4.6–10.2)

## 2014-01-16 MED ORDER — DOXYCYCLINE HYCLATE 100 MG PO CAPS: 100.0000 mg | ORAL_CAPSULE | Freq: Two times a day (BID) | ORAL | Status: DC

## 2014-01-16 MED ORDER — KETOROLAC TROMETHAMINE 60 MG/2ML IM SOLN
60.0000 mg | Freq: Once | INTRAMUSCULAR | Status: AC
  Administered 2014-01-16: 60 mg via INTRAMUSCULAR

## 2014-01-16 MED ORDER — CEFTRIAXONE SODIUM 1 G IJ SOLR
1.0000 g | Freq: Once | INTRAMUSCULAR | Status: AC
  Administered 2014-01-16: 1 g via INTRAMUSCULAR

## 2014-01-16 MED ORDER — AMOXICILLIN-POT CLAVULANATE 875-125 MG PO TABS: 1.0000 | ORAL_TABLET | Freq: Two times a day (BID) | ORAL | Status: DC
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
## 2014-01-17 ENCOUNTER — Ambulatory Visit (INDEPENDENT_AMBULATORY_CARE_PROVIDER_SITE_OTHER): Payer: 59 | Admitting: Family Medicine

## 2014-01-17 VITALS — BP 100/60 | HR 82 | Temp 98.0°F | Resp 18 | Ht 70.0 in | Wt 210.0 lb

## 2014-01-17 DIAGNOSIS — F1310 Sedative, hypnotic or anxiolytic abuse, uncomplicated: Secondary | ICD-10-CM

## 2014-01-17 DIAGNOSIS — M25531 Pain in right wrist: Secondary | ICD-10-CM

## 2014-01-17 DIAGNOSIS — L03119 Cellulitis of unspecified part of limb: Secondary | ICD-10-CM

## 2014-01-17 DIAGNOSIS — M25539 Pain in unspecified wrist: Secondary | ICD-10-CM

## 2014-01-17 DIAGNOSIS — L02519 Cutaneous abscess of unspecified hand: Secondary | ICD-10-CM

## 2014-01-17 DIAGNOSIS — F1110 Opioid abuse, uncomplicated: Secondary | ICD-10-CM

## 2014-01-17 LAB — POCT CBC
Granulocyte percent: 76.2 %G (ref 37–80)
HCT, POC: 44.9 % (ref 43.5–53.7)
HEMOGLOBIN: 14.6 g/dL (ref 14.1–18.1)
Lymph, poc: 2.7 (ref 0.6–3.4)
MCH: 30.8 pg (ref 27–31.2)
MCHC: 32.5 g/dL (ref 31.8–35.4)
MCV: 94.7 fL (ref 80–97)
MID (cbc): 1 — AB (ref 0–0.9)
MPV: 8.8 fL (ref 0–99.8)
POC Granulocyte: 11.7 — AB (ref 2–6.9)
POC LYMPH PERCENT: 17.3 %L (ref 10–50)
POC MID %: 6.5 %M (ref 0–12)
Platelet Count, POC: 297 10*3/uL (ref 142–424)
RBC: 4.74 M/uL (ref 4.69–6.13)
RDW, POC: 13.9 %
WBC: 15.4 10*3/uL — AB (ref 4.6–10.2)

## 2014-01-17 MED ORDER — CEFTRIAXONE SODIUM 1 G IJ SOLR
1.0000 g | Freq: Once | INTRAMUSCULAR | Status: AC
  Administered 2014-01-17: 1 g via INTRAMUSCULAR

## 2014-01-17 MED ORDER — CEFTRIAXONE SODIUM 1 G IJ SOLR: 1.0000 g | Freq: Once | INTRAMUSCULAR | Status: DC

## 2014-01-17 NOTE — Patient Instructions (Signed)
Your blood count is still elevated, but minimally improved.  Continue the antibiotics as prescribed, one more injection of rocephin today and recheck with Luke Harper tomorrow between 8 am and 3pm. Try to minimize use of that wrist for now. Return to the clinic or go to the nearest emergency room if any of your symptoms worsen or new symptoms occur.  As discussed, look into options for suboxone and cessation of drug use.  Can discuss with [facility] or Dr. Salvadore Farberorrington and let me know if we can help in referring you if needed.
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
## 2014-01-18 ENCOUNTER — Ambulatory Visit (INDEPENDENT_AMBULATORY_CARE_PROVIDER_SITE_OTHER): Payer: 59 | Admitting: Family Medicine

## 2014-01-18 VITALS — BP 116/72 | HR 71 | Temp 97.0°F | Resp 16 | Ht 70.0 in | Wt 201.6 lb

## 2014-01-18 DIAGNOSIS — M79609 Pain in unspecified limb: Secondary | ICD-10-CM

## 2014-01-18 DIAGNOSIS — L0390 Cellulitis, unspecified: Secondary | ICD-10-CM

## 2014-01-18 DIAGNOSIS — L0291 Cutaneous abscess, unspecified: Secondary | ICD-10-CM

## 2014-01-18 DIAGNOSIS — K219 Gastro-esophageal reflux disease without esophagitis: Secondary | ICD-10-CM

## 2014-01-18 LAB — POCT CBC
Granulocyte percent: 68.7 %G (ref 37–80)
HCT, POC: 46.1 % (ref 43.5–53.7)
Hemoglobin: 15.1 g/dL (ref 14.1–18.1)
Lymph, poc: 2.2 (ref 0.6–3.4)
MCH, POC: 30.7 pg (ref 27–31.2)
MCHC: 32.8 g/dL (ref 31.8–35.4)
MCV: 93.6 fL (ref 80–97)
MID (cbc): 0.7 (ref 0–0.9)
MPV: 8.9 fL (ref 0–99.8)
POC Granulocyte: 6.5 (ref 2–6.9)
POC LYMPH PERCENT: 23.7 %L (ref 10–50)
POC MID %: 7.6 %M (ref 0–12)
Platelet Count, POC: 340 10*3/uL (ref 142–424)
RBC: 4.92 M/uL (ref 4.69–6.13)
RDW, POC: 14.2 %
WBC: 9.4 10*3/uL (ref 4.6–10.2)

## 2014-01-18 MED ORDER — CEFTRIAXONE SODIUM 1 G IJ SOLR
1.0000 g | INTRAMUSCULAR | Status: DC
  Administered 2014-01-18: 1 g via INTRAMUSCULAR

## 2014-01-18 NOTE — Patient Instructions (Signed)
Cellulitis Cellulitis is an infection of the skin and the tissue beneath it. The infected area is usually red and tender. Cellulitis occurs most often in the arms and lower legs.  CAUSES  Cellulitis is caused by bacteria that enter the skin through cracks or cuts in the skin. The most common types of bacteria that cause cellulitis are Staphylococcus and Streptococcus. SYMPTOMS   Redness and warmth.  Swelling.  Tenderness or pain.  Fever. DIAGNOSIS  Your caregiver can usually determine what is wrong based on a physical exam. Blood tests may also be done. TREATMENT  Treatment usually involves taking an antibiotic medicine. HOME CARE INSTRUCTIONS   Take your antibiotics as directed. Finish them even if you start to feel better.  Keep the infected arm or leg elevated to reduce swelling.  Apply a warm cloth to the affected area up to 4 times per day to relieve pain.  Only take over-the-counter or prescription medicines for pain, discomfort, or fever as directed by your caregiver.  Keep all follow-up appointments as directed by your caregiver. SEEK MEDICAL CARE IF:   You notice red streaks coming from the infected area.  Your red area gets larger or turns dark in color.  Your bone or joint underneath the infected area becomes painful after the skin has healed.  Your infection returns in the same area or another area.  You notice a swollen bump in the infected area.  You develop new symptoms. SEEK IMMEDIATE MEDICAL CARE IF:   You have a fever.  You feel very sleepy.  You develop vomiting or diarrhea.  You have a general ill feeling (malaise) with muscle aches and pains. MAKE SURE YOU:   Understand these instructions.  Will watch your condition.  Will get help right away if you are not doing well or get worse. Document Released: 06/05/2005 Document Revised: 02/25/2012 Document Reviewed: 11/11/2011 [employer] Patient Information 2014 [employer].  

## 2014-01-18 NOTE — Progress Notes (Signed)
Subjective:  This chart was scribed for Elvina SidleKurt Arneda Sappington, MD  by Ashley JacobsBrittany Andrews, [facility] Scribe. The patient was seen in room and the patient's care was started at 1:15 PM.   Patient ID: Luke Harper, male    DOB: 05/21/1966, 48 y.o.   MRN: [id number]  01/18/2014  Follow-up   HPI  HPI Comments: Luke Harper is a 48 y.o. male who arrives to the [facility] for a follow up. He was given a shot of antibiotics and he is still taking the antibiotics while at home. Pt states that the pain and swelling is decreasing. Pt last seen two days ago. He has not used anymore drugs in the past two days.   Pt works as an Teacher, musicArborist and a Plumber. He is currently un-employed.  Review of Systems  Skin: Positive for wound.    Past Medical History  Diagnosis Date  . Anxiety   . Depression    No Known Allergies Current Outpatient Prescriptions  Medication Sig Dispense Refill  . amoxicillin-clavulanate (AUGMENTIN) 875-125 MG per tablet Take 1 tablet by mouth 2 (two) times daily.  20 tablet  0  . amphetamine-dextroamphetamine (ADDERALL) 10 MG tablet Take 10 mg by mouth 3 (three) times daily.      . cefTRIAXone (ROCEPHIN) 1 G injection Inject 1 g into the muscle once.  1 each  0  . celecoxib (CELEBREX) 200 MG capsule Take 200 mg by mouth daily.      . clonazePAM (KLONOPIN) 1 MG tablet Take 1 mg by mouth 3 (three) times daily as needed for anxiety.       Marland Kitchen. doxycycline (VIBRAMYCIN) 100 MG capsule Take 1 capsule (100 mg total) by mouth 2 (two) times daily.  20 capsule  0  . lithium carbonate 300 MG capsule Take 600 mg by mouth at bedtime.       . Multiple Vitamin (MULTIVITAMIN WITH MINERALS) TABS Take 1 tablet by mouth daily.      Marland Kitchen. PRISTIQ 100 MG 24 hr tablet Take 100 mg by mouth daily.        No current facility-administered medications for this visit.       Objective:    BP 116/72  Pulse 71  Temp(Src) 97 F (36.1 C) (Oral)  Resp 16  Ht 5\' 10"  (1.778  m)  Wt 201 lb 9.6 oz (91.445 kg)  BMI 28.93 kg/m2  SpO2 97% Physical Exam Results for orders placed in visit on 01/18/14  POCT CBC      Result Value Ref Range   WBC 9.4  4.6 - 10.2 K/uL   Lymph, poc 2.2  0.6 - 3.4   POC LYMPH PERCENT 23.7  10 - 50 %L   MID (cbc) 0.7  0 - 0.9   POC MID % 7.6  0 - 12 %M   POC Granulocyte 6.5  2 - 6.9   Granulocyte percent 68.7  37 - 80 %G   RBC 4.92  4.69 - 6.13 M/uL   Hemoglobin 15.1  14.1 - 18.1 g/dL   HCT, POC 40.946.1  81.143.5 - 53.7 %   MCV 93.6  80 - 97 fL   MCH, POC 30.7  27 - 31.2 pg   MCHC 32.8  31.8 - 35.4 g/dL   RDW, POC 91.414.2     Platelet Count, POC 340  142 - 424 K/uL   MPV 8.9  0 - 99.8 fL  Assessment & Plan:    Cellulitis - Plan: POCT CBC, cefTRIAXone (ROCEPHIN) injection 1 g Continue current antibiotics and no need to return unless redness, swelling, or pain increase. Signed, Elvina SidleKurt Giavonna Pflum, MD

## 2014-01-20 ENCOUNTER — Telehealth: Payer: Self-pay

## 2014-01-20 NOTE — Telephone Encounter (Signed)
PATIENT STATES HE SAW DR. Neva SeatGREENE A FEW DAYS AGO FOR SOMETHING ELSE. HOWEVER, DR. Neva SeatGREENE TOLD HIM TO CALL BACK IF HE WAS INTERESTED IN GETTING A REFERRAL TO SEE DR. KIP CORRINGTON FOR NARCOTIC ABUSE. HE WOULD LIKE DR. GREENE TO GO FORWARD WITH THE REFERRAL FOR HIM. BEST PHONE [phone] (CELL)   MBC

## 2014-01-22 NOTE — Telephone Encounter (Signed)
Pt went ahead and made an appt and saw him on Thurs.

## 2014-01-22 NOTE — Telephone Encounter (Signed)
He should not need a referral as he was a patient of Dr. Salvadore Farberorrington prior - can just call his office for appt or to determine how he can see Dr. Salvadore Farberorrington.

## 2014-07-14 NOTE — Addendum Note (Signed)
Addended by: Ethelda ChickSMITH, Cecia Egge M on: 07/14/2014 08:15 AM   Modules accepted: Level of Service

## 2014-07-14 NOTE — Progress Notes (Signed)
History and physical examinations obtained with Heather Marte, PA-C. Agree with assessment and plan. 

## 2014-08-31 ENCOUNTER — Emergency Department (HOSPITAL_COMMUNITY): Payer: 59

## 2014-08-31 ENCOUNTER — Encounter (HOSPITAL_COMMUNITY): Payer: Self-pay

## 2014-08-31 ENCOUNTER — Emergency Department (HOSPITAL_COMMUNITY)
Admission: EM | Admit: 2014-08-31 | Discharge: 2014-08-31 | Disposition: A | Discharge status: Home / Self Care | Payer: 59 | Attending: Emergency Medicine | Admitting: Emergency Medicine

## 2014-08-31 DIAGNOSIS — Z792 Long term (current) use of antibiotics: Secondary | ICD-10-CM | POA: Diagnosis not present

## 2014-08-31 DIAGNOSIS — R4182 Altered mental status, unspecified: Secondary | ICD-10-CM

## 2014-08-31 DIAGNOSIS — F1310 Sedative, hypnotic or anxiolytic abuse, uncomplicated: Secondary | ICD-10-CM | POA: Insufficient documentation

## 2014-08-31 DIAGNOSIS — F1110 Opioid abuse, uncomplicated: Secondary | ICD-10-CM | POA: Insufficient documentation

## 2014-08-31 DIAGNOSIS — F1510 Other stimulant abuse, uncomplicated: Secondary | ICD-10-CM | POA: Diagnosis not present

## 2014-08-31 DIAGNOSIS — T401X1A Poisoning by heroin, accidental (unintentional), initial encounter: Secondary | ICD-10-CM | POA: Insufficient documentation

## 2014-08-31 DIAGNOSIS — F329 Major depressive disorder, single episode, unspecified: Secondary | ICD-10-CM | POA: Diagnosis not present

## 2014-08-31 DIAGNOSIS — F419 Anxiety disorder, unspecified: Secondary | ICD-10-CM | POA: Insufficient documentation

## 2014-08-31 DIAGNOSIS — Y9389 Activity, other specified: Secondary | ICD-10-CM | POA: Insufficient documentation

## 2014-08-31 DIAGNOSIS — Z720 Tobacco use: Secondary | ICD-10-CM | POA: Diagnosis not present

## 2014-08-31 DIAGNOSIS — F1410 Cocaine abuse, uncomplicated: Secondary | ICD-10-CM | POA: Diagnosis not present

## 2014-08-31 DIAGNOSIS — Y998 Other external cause status: Secondary | ICD-10-CM | POA: Diagnosis not present

## 2014-08-31 DIAGNOSIS — Y9289 Other specified places as the place of occurrence of the external cause: Secondary | ICD-10-CM | POA: Diagnosis not present

## 2014-08-31 DIAGNOSIS — Z791 Long term (current) use of non-steroidal anti-inflammatories (NSAID): Secondary | ICD-10-CM | POA: Diagnosis not present

## 2014-08-31 DIAGNOSIS — F1910 Other psychoactive substance abuse, uncomplicated: Secondary | ICD-10-CM

## 2014-08-31 DIAGNOSIS — Z79899 Other long term (current) drug therapy: Secondary | ICD-10-CM | POA: Diagnosis not present

## 2014-08-31 LAB — CBC
HCT: 46.2 % (ref 39.0–52.0)
Hemoglobin: 15.8 g/dL (ref 13.0–17.0)
MCH: 31.3 pg (ref 26.0–34.0)
MCHC: 34.2 g/dL (ref 30.0–36.0)
MCV: 91.7 fL (ref 78.0–100.0)
PLATELETS: 217 10*3/uL (ref 150–400)
RBC: 5.04 MIL/uL (ref 4.22–5.81)
RDW: 12.8 % (ref 11.5–15.5)
WBC: 15 10*3/uL — AB (ref 4.0–10.5)

## 2014-08-31 LAB — URINALYSIS, ROUTINE W REFLEX MICROSCOPIC
Bilirubin Urine: NEGATIVE
Glucose, UA: NEGATIVE mg/dL
Hgb urine dipstick: NEGATIVE
Ketones, ur: NEGATIVE mg/dL
Leukocytes, UA: NEGATIVE
NITRITE: NEGATIVE
Protein, ur: 100 mg/dL — AB
SPECIFIC GRAVITY, URINE: 1.018 (ref 1.005–1.030)
Urobilinogen, UA: 1 mg/dL (ref 0.0–1.0)
pH: 5 (ref 5.0–8.0)

## 2014-08-31 LAB — URINE MICROSCOPIC-ADD ON

## 2014-08-31 LAB — COMPREHENSIVE METABOLIC PANEL
ALBUMIN: 3.7 g/dL (ref 3.5–5.2)
ALT: 51 U/L (ref 0–53)
AST: 45 U/L — AB (ref 0–37)
Alkaline Phosphatase: 65 U/L (ref 39–117)
Anion gap: 7 (ref 5–15)
BUN: 10 mg/dL (ref 6–23)
CALCIUM: 8.2 mg/dL — AB (ref 8.4–10.5)
CO2: 25 mmol/L (ref 19–32)
CREATININE: 0.98 mg/dL (ref 0.50–1.35)
Chloride: 103 mEq/L (ref 96–112)
GFR calc Af Amer: >90 mL/min (ref >90)
Glucose, Bld: 191 mg/dL — ABNORMAL HIGH (ref 70–99)
Potassium: 3.1 mmol/L — ABNORMAL LOW (ref 3.5–5.1)
Sodium: 135 mmol/L (ref 135–145)
Total Bilirubin: 1 mg/dL (ref 0.3–1.2)
Total Protein: 6.5 g/dL (ref 6.0–8.3)

## 2014-08-31 LAB — RAPID URINE DRUG SCREEN, HOSP PERFORMED
Amphetamines: POSITIVE — AB
Barbiturates: NOT DETECTED
Benzodiazepines: POSITIVE — AB
Cocaine: POSITIVE — AB
Opiates: POSITIVE — AB
Tetrahydrocannabinol: NOT DETECTED

## 2014-08-31 LAB — ACETAMINOPHEN LEVEL

## 2014-08-31 LAB — AMMONIA: AMMONIA: 36 umol/L — AB (ref 11–32)

## 2014-08-31 LAB — SALICYLATE LEVEL

## 2014-08-31 LAB — CBG MONITORING, ED: Glucose-Capillary: 189 mg/dL — ABNORMAL HIGH (ref 70–99)

## 2014-08-31 LAB — ETHANOL

## 2014-08-31 LAB — I-STAT CG4 LACTIC ACID, ED: LACTIC ACID, VENOUS: 2.16 mmol/L (ref 0.5–2.2)

## 2014-08-31 MED ORDER — SODIUM CHLORIDE 0.9 % IV BOLUS (SEPSIS)
1000.0000 mL | Freq: Once | INTRAVENOUS | Status: AC
  Administered 2014-08-31: 1000 mL via INTRAVENOUS

## 2014-08-31 MED ORDER — NALOXONE HCL 1 MG/ML IJ SOLN
2.0000 mg | Freq: Once | INTRAMUSCULAR | Status: AC
  Administered 2014-08-31: 2 mg via INTRAVENOUS

## 2014-08-31 MED ORDER — NALOXONE HCL 1 MG/ML IJ SOLN
INTRAMUSCULAR | Status: AC
  Filled 2014-08-31: qty 2

## 2014-08-31 NOTE — ED Notes (Signed)
Oxygen discontinued. Will monitor pt.

## 2014-08-31 NOTE — ED Notes (Addendum)
Pt alert to self but disoriented to other. Pt reoriented to situation. Pt drowsy and resting at present time. Pt denies SI/HI.
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
## 2014-08-31 NOTE — ED Notes (Signed)
Pt alert to self, aware at hospital, and able to state month/year. Pt reoriented to situation and date.

## 2014-08-31 NOTE — ED Notes (Addendum)
pts wife called back, pt gave verbal agreement that nurse could discuss medical information with wife over the phone. [place], wife is going to call pts mother and see if mother can come pick up pt. Mother will call ED to confirm.

## 2014-08-31 NOTE — ED Notes (Signed)
Patient is resting will walk him when he awakes, breakfast is placed at his bed side

## 2014-08-31 NOTE — ED Notes (Addendum)
Pt still unsteady. Unable to ambulate at this time.

## 2014-08-31 NOTE — ED Notes (Signed)
Pt continues to eat breakfast. Pt given phone to attempt to call friend or family.

## 2014-08-31 NOTE — ED Provider Notes (Signed)
Pt ambulating, speaking and eating without difficulty.  Family now present to take pt home.  Gwyneth SproutWhitney Kaisley Stiverson, MD 08/31/14 1245

## 2014-08-31 NOTE — ED Notes (Addendum)
Pt needs ride home to be discharged, charge asked pt if he wanted to call anyone. He said he didn't have anyone to call. Charge said she would call his emergency contacts, who are wife and mother.neither answered phone, messages left.

## 2014-08-31 NOTE — ED Notes (Signed)
Pt ambulated independently in hall without difficulty. Pt currently eating breakfast at bedside.

## 2014-08-31 NOTE — Discharge Instructions (Signed)
°Emergency Department Resource Guide °1) Find a Doctor and Pay Out of Pocket °Although you won't have to find out who is covered by your insurance plan, it is a good idea to ask around and get recommendations. You will then need to call the office and see if the doctor you have chosen will accept you as a new patient and what types of options they offer for patients who are self-pay. Some doctors offer discounts or will set up payment plans for their patients who do not have insurance, but you will need to ask so you aren't surprised when you get to your appointment. ° °2) Contact Your Local Health Department °Not all health departments have doctors that can see patients for sick visits, but many do, so it is worth a call to see if yours does. If you don't know where your local health department is, you can check in your phone book. The CDC also has a tool to help you locate your state's health department, and many state websites also have listings of all of their local health departments. ° °3) Find a Walk-in Clinic °If your illness is not likely to be very severe or complicated, you may want to try a walk in clinic. These are popping up all over the country in pharmacies, drugstores, and shopping centers. They're usually staffed by nurse practitioners or physician assistants that have been trained to treat common illnesses and complaints. They're usually fairly quick and inexpensive. However, if you have serious medical issues or chronic medical problems, these are probably not your best option. ° °No Primary Care Doctor: °- Call [facility] at  832-8000 - they can help you locate a primary care doctor that  accepts your insurance, provides certain services, etc. °- Physician Referral Service- 1-800-533-3463 ° °Chronic Pain Problems: °Organization         Address  Phone   Notes  °[facility]  (336) 297-2271 Patients need to be referred by their primary care doctor.  ° °Medication  Assistance: °Organization         Address  Phone   Notes  °[place] [facility] [address] °[place], [place] [address] (336) 641-8030 --Must be a resident of [place] °-- Must have NO insurance coverage whatsoever (no Medicaid/ Medicare, etc.) °-- The pt. MUST have a primary care doctor that directs their care regularly and follows them in the community °  °[facility]  (866) 331-1348   °[employer]  (888) 892-1162   ° °Agencies that provide inexpensive medical care: °Organization         Address  Phone   Notes  °[facility]  (336) 832-8035   °[facility]    (336) 832-7272   °[facility] [address] °[place], [place] [address] (336) 832-4777   °[facility] [address], °[place] (336) 271-4999   °[facility]    (336) 373-0678   °[facility]    (336) 272-1050   °[facility] ° [address], [place] Phone:  (336) 832-4444, Fax:  (336) 832-4440 Hours of Operation:  9 am - 6 pm, M-F.  Also accepts Medicaid/Medicare and self-pay.  °[facility] ° [address], [place] Phone: (336) 832-3150, Fax: (336) 832-3151. Hours of Operation:  8:30 am - 5:30 pm, M-F.  Also accepts Medicaid and self-pay.  °[facility] [place] [address], [place] Phone: (336) 878-6027   °[facility] [address], [place], [place] (336)723-1848, Ext. 123 Mondays & Thursdays: 7-9 AM.  First 15 patients are seen on a first come, first serve basis. °  ° °Medicaid-accepting [place] Providers: ° °Organization         Address  Phone   Notes  °[facility] [address], [place] (336) 641-2100 Also accepts self-pay patients.  °[facility] [address], [place] ° (336) 856-9996   °[facility] [address], [place]  (336) 288-8857   °[facility] [address] [place] [address], [place] (336) 299-7000   °Veita Bland [address], [place]  ° (336) 373-1557 Only accepts [place] Access Medicaid patients after they have their name applied to their card.  ° °Self-Pay (no insurance) in [place]: ° °Organization         Address  Phone   Notes  °Sickle Cell Patients, [facility] [address], [place] (336) 832-1970   °[facility] [address], [place] (336) 832-4400   °[facility] ° [address],  (336) 992-4800   °[facility]/Dr. Osei-Bonsu ° [address] [place] [address], [place] or [address], [place] (336) 841-8500 Phone number for both [place] and [place] locations is the same.  °[facility] [address], [place] (336) 299-0000   °[facility] [address] [place] [address], [place] or [address] (336) 852-7530 °(336) 878-2260   °[facility] [address], [place] (336) 350-1642, phone; (336) 294-5005, fax Sees patients 1st and 3rd Saturday of every month.  Must not qualify for public or private insurance (i.e. Medicaid, Medicare, [place] Health Choice, Veterans' Benefits) • Household income should be no more than 200% of the poverty level •The clinic cannot treat you if you are pregnant or think you are pregnant • Sexually transmitted diseases are not treated at the clinic.  ° ° °Dental Care: °Organization         Address  Phone  Notes  °[place] [facility] [address], [place] (336) 641-6152 Accepts children up to age 21 who are enrolled in Medicaid or [place] Health Choice; pregnant women with a Medicaid card; and children who have applied for Medicaid or [place] Health Choice, but were declined, whose parents can pay a reduced fee at time of service.  °[place]  [facility] [place]  [address], [place] (336) 641-7733 Accepts children up to age 21 who are enrolled in Medicaid or [place] Health Choice; pregnant women with a Medicaid card; and children who have applied for Medicaid or [place] Health Choice, but were declined, whose parents can pay a reduced fee at time of service.  °[facility] ° [address], [place] (336) 641-4533 Patients are seen by appointment only. Walk-ins are not accepted. [facility] will see patients 18 years of age and older. °Monday - Tuesday (8am-5pm) °Most Wednesdays (8:30-5pm) °$30 per visit, cash only  °[facility] ° [address], [place] (336) 641-4533 Patients are seen by appointment only. Walk-ins are not accepted. [facility] will see patients 18 years of age and older. °One   Wednesday Evening (Monthly: Volunteer Based).  $30 per visit, cash only  °[facility]  (919) 537-3737 for adults; Children under age 4, call Graduate Pediatric Dentistry at (919) [phone]. Children aged 4-14, please call (919) [phone] to request a pediatric application. ° Dental services are provided in all areas of dental care including fillings, crowns and bridges, complete and partial dentures, implants, gum treatment, root canals, and extractions. Preventive care is also provided. Treatment is provided to both adults and children. °Patients are selected via a lottery and there is often a waiting list. °  °[facility] [address], °[place] ° (336) 763-8833 [email or web address] °  °[facility] [address], [place], [place] (336)723-1848, Ext. 123 Second and Fourth Thursday of each month, opens at 6:30 AM; Clinic ends at 9 AM.  Patients are seen on a first-come first-served basis, and a limited number are seen during each clinic.  ° °[facility] ° [address], [place], [place] (336) 723-7904    Eligibility Requirements °You must have lived in [place], [place], or [place] counties for at least the last three months. °  You cannot be eligible for state or federal sponsored healthcare insurance, including Veterans Administration, Medicaid, or Medicare. °  You generally cannot be eligible for healthcare insurance through your employer.  °  How to apply: °Eligibility screenings are held every Tuesday and Wednesday afternoon from 1:00 pm until 4:00 pm. You do not need an appointment for the interview!  °[facility] [address], [place], [place] 336-631-2330   °[facility]  336-342-8273   °[facility]  336-703-3100   °[facility]  336-570-6415   ° °Behavioral Health Resources in the Community: °Intensive Outpatient Programs °Organization         Address  Phone  Notes  °[facility] [address], [place], [place] 336-878-6098   °[facility] [address], [place], [place] 336-832-9800   °[facility] [address], [place], [place] ° 336-882-2125   °[facility] [address],  °[place], [place] 1-800-853-5163 or 336-641-4981   °Substance Abuse Resources °Organization         Address  Phone  Notes  °[facility]  336-882-2125   °[facility]  336-784-9470   °[facility]  336-285-9073   °[facility]  336-845-3988   °Residential & Outpatient Substance Abuse Program  1-800-659-3381   °Psychological Services °Organization         Address  Phone  Notes  °[facility]  336- 832-9600   °[facility]  336- 378-7881   °[facility] [address], [place] 1-800-853-5163 or 336-641-4981   ° °Mobile Crisis Teams °Organization         Address  Phone  Notes  °[facility], Mobile Crisis Care Unit  1-877-626-1772   °Assertive °[facility] ° [address]  [place], [place] 336-834-9664   °Sharon DeEsch [address] °[place] 336-554-5454   ° °Self-Help/Support Groups °Organization         Address  Phone             Notes  °[facility] [place] - variety of support groups  336- 373-1402 Call for more information  °Narcotics Anonymous (NA), [facility] [address], °[place]  2 meetings at this location  ° °  Residential Treatment Programs °Organization         Address  Phone  Notes  °[facility] [address],    °[place]  1-866-801-8205   °[facility] ° [address], [place], [place] 704-293-8524   °[facility] [address], [place] 336-845-3988 Admissions: 8am-3pm M-F  °[facility] [address]    °[place], [place] 336-841-1104   °[facility] [address], [place], [place] 336-379-7146   °[facility] [address]  °[place], [place] 336-285-9073   °[facility] - Intensive Outpatient [address], [place], [place] 336-852-3033   °[facility] [address]  °[place], [place] 1-877-615-2722 or 336-784-9470   °[facility] [address] [place], [place] 336-227-7417 Accepts Medicaid  °[facility] [address]  °[place] 1-800-659-3381 Substance Abuse/Addiction Treatment  ° °[place] Behavioral Health Resources °Organization         Address  Phone  Notes  °[facility]  (888) 581-9988   °Julie Brannon, PhD [address] [place], [place]   (336) 349-5553 or (336) 951-0000   °[facility]   [address] °[place], [place] (336) 349-4454   °[facility] [address], [place], [place] (336) 342-8316 Insurance/Medicaid/sponsorship through [facility]  °[facility] [address]                                    [place], [place] (336) 342-8316 Therapy/tele-psych/case    °[facility] [address]  ° [place], [place] (336) 349-2233    °Dr. Arfeen  (336) 349-4544   °[facility] [place]  [employer] [place] [facility] 1) [address], [place] °2) [address], [place] °3)  [address], [place] (336) 349-3220 °(336) 342-7768 ° °(336) 342-8140   °[place] [facility] (336) 342-1394 or (336) 342-3537 (After Hours)    ° ° °
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
## 2017-11-08 DIAGNOSIS — S46111A Strain of muscle, fascia and tendon of long head of biceps, right arm, initial encounter: Secondary | ICD-10-CM | POA: Diagnosis not present

## 2017-11-08 DIAGNOSIS — S51051A Open bite, right elbow, initial encounter: Secondary | ICD-10-CM | POA: Diagnosis not present

## 2017-11-08 DIAGNOSIS — S61051A Open bite of right thumb without damage to nail, initial encounter: Secondary | ICD-10-CM | POA: Diagnosis not present

## 2017-11-12 DIAGNOSIS — S61051A Open bite of right thumb without damage to nail, initial encounter: Secondary | ICD-10-CM | POA: Diagnosis not present

## 2017-11-12 DIAGNOSIS — S61250A Open bite of right index finger without damage to nail, initial encounter: Secondary | ICD-10-CM | POA: Diagnosis not present

## 2017-11-12 DIAGNOSIS — S41151A Open bite of right upper arm, initial encounter: Secondary | ICD-10-CM | POA: Diagnosis not present

## 2018-08-01 ENCOUNTER — Emergency Department (HOSPITAL_COMMUNITY): Payer: Self-pay

## 2018-08-01 ENCOUNTER — Other Ambulatory Visit: Payer: Self-pay

## 2018-08-01 ENCOUNTER — Emergency Department (HOSPITAL_COMMUNITY)
Admission: EM | Admit: 2018-08-01 | Discharge: 2018-08-01 | Disposition: A | Discharge status: Home / Self Care | Payer: Self-pay | Attending: Emergency Medicine | Admitting: Emergency Medicine

## 2018-08-01 DIAGNOSIS — Z79899 Other long term (current) drug therapy: Secondary | ICD-10-CM | POA: Insufficient documentation

## 2018-08-01 DIAGNOSIS — R1110 Vomiting, unspecified: Secondary | ICD-10-CM | POA: Insufficient documentation

## 2018-08-01 DIAGNOSIS — T401X1A Poisoning by heroin, accidental (unintentional), initial encounter: Secondary | ICD-10-CM | POA: Insufficient documentation

## 2018-08-01 DIAGNOSIS — F17210 Nicotine dependence, cigarettes, uncomplicated: Secondary | ICD-10-CM | POA: Insufficient documentation

## 2018-08-01 LAB — CBC WITH DIFFERENTIAL/PLATELET
Abs Immature Granulocytes: 0.08 10*3/uL — ABNORMAL HIGH (ref 0.00–0.07)
BASOS PCT: 0 %
Basophils Absolute: 0 10*3/uL (ref 0.0–0.1)
EOS ABS: 0 10*3/uL (ref 0.0–0.5)
EOS PCT: 0 %
HCT: 46.2 % (ref 39.0–52.0)
Hemoglobin: 14.8 g/dL (ref 13.0–17.0)
Immature Granulocytes: 1 %
Lymphocytes Relative: 17 %
Lymphs Abs: 1.5 10*3/uL (ref 0.7–4.0)
MCH: 31.2 pg (ref 26.0–34.0)
MCHC: 32 g/dL (ref 30.0–36.0)
MCV: 97.5 fL (ref 80.0–100.0)
Monocytes Absolute: 0.7 10*3/uL (ref 0.1–1.0)
Monocytes Relative: 8 %
NRBC: 0 % (ref 0.0–0.2)
Neutro Abs: 6.5 10*3/uL (ref 1.7–7.7)
Neutrophils Relative %: 74 %
PLATELETS: 238 10*3/uL (ref 150–400)
RBC: 4.74 MIL/uL (ref 4.22–5.81)
RDW: 13.2 % (ref 11.5–15.5)
WBC: 8.9 10*3/uL (ref 4.0–10.5)

## 2018-08-01 LAB — COMPREHENSIVE METABOLIC PANEL
ALT: 76 U/L — AB (ref 0–44)
AST: 61 U/L — ABNORMAL HIGH (ref 15–41)
Albumin: 4.2 g/dL (ref 3.5–5.0)
Alkaline Phosphatase: 60 U/L (ref 38–126)
Anion gap: 8 (ref 5–15)
BILIRUBIN TOTAL: 1.1 mg/dL (ref 0.3–1.2)
BUN: 16 mg/dL (ref 6–20)
CALCIUM: 8.9 mg/dL (ref 8.9–10.3)
CO2: 29 mmol/L (ref 22–32)
Chloride: 105 mmol/L (ref 98–111)
Creatinine, Ser: 0.98 mg/dL (ref 0.61–1.24)
Glucose, Bld: 148 mg/dL — ABNORMAL HIGH (ref 70–99)
Potassium: 3.6 mmol/L (ref 3.5–5.1)
Sodium: 142 mmol/L (ref 135–145)
TOTAL PROTEIN: 7 g/dL (ref 6.5–8.1)

## 2018-08-01 LAB — RAPID HIV SCREEN (HIV 1/2 AB+AG)
HIV 1/2 ANTIBODIES: NONREACTIVE
HIV-1 P24 ANTIGEN - HIV24: NONREACTIVE

## 2018-08-01 LAB — I-STAT TROPONIN, ED: Troponin i, poc: 0.01 ng/mL (ref 0.00–0.08)

## 2018-08-01 LAB — SALICYLATE LEVEL: Salicylate Lvl: <7 mg/dL (ref 2.8–30.0)

## 2018-08-01 LAB — ACETAMINOPHEN LEVEL: Acetaminophen (Tylenol), Serum: <10 ug/mL — ABNORMAL LOW (ref 10–30)

## 2018-08-01 LAB — CBG MONITORING, ED: GLUCOSE-CAPILLARY: 106 mg/dL — AB (ref 70–99)

## 2018-08-01 LAB — ETHANOL: Alcohol, Ethyl (B): <10 mg/dL (ref <10)

## 2018-08-01 MED ORDER — SODIUM CHLORIDE 0.9 % IV BOLUS
1000.0000 mL | Freq: Once | INTRAVENOUS | Status: AC
  Administered 2018-08-01: 1000 mL via INTRAVENOUS

## 2018-08-01 NOTE — ED Provider Notes (Signed)
Patient seen and examined.  Resting comfortably at this time.  No signs of respiratory compromise.  Awake and alert and stable for discharge   Luke Harper, Luke Cabello, MD 08/01/18 1015

## 2018-08-01 NOTE — ED Provider Notes (Signed)
[facility]-EMERGENCY DEPT Provider Note  CSN: [id number] Arrival date & time: 08/01/18 0510  Chief Complaint(s) No chief complaint on file.  HPI Luke Harper is a 52 y.o. male who came in for heroin overdose.  Patient was found in a home reportedly by bystanders who found the patient unresponsive and apneic.  They initiated bystander CPR. Fire was first on scene noted that the patient was apneic 4-6 times a minute. Fire also noted that there was a box of intranasal Narcan by the patient and was empty.  None of the bystanders admitted to administering Narcan.  Upon EMS arrival patient was being in bagged.  He had several episodes of nonbloody nonbilious emesis.  When he became more responsive, he admitted to heroin use.  Currently patient denies any physical complaints.  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  Denies any other illicit drug use.  HPI  Past Medical History Past Medical History:  Diagnosis Date  . Anxiety   . Depression    There are no active problems to display for this patient.  Home Medication(s) Prior to Admission medications   Medication Sig Start Date End Date Taking? Authorizing Provider  amoxicillin-clavulanate (AUGMENTIN) 875-125 MG per tablet Take 1 tablet by mouth 2 (two) times daily. 01/16/14   Nelva Nay, PA-C  amphetamine-dextroamphetamine (ADDERALL) 10 MG tablet Take 10 mg by mouth 3 (three) times daily.    [provider]  cefTRIAXone (ROCEPHIN) 1 G injection Inject 1 g into the muscle once. 01/17/14   Shade Flood, MD  celecoxib (CELEBREX) 200 MG capsule Take 200 mg by mouth daily.    [provider]  clonazePAM (KLONOPIN) 1 MG tablet Take 1 mg by mouth 3 (three) times daily as needed for anxiety.  10/15/13   [provider]  doxycycline (VIBRAMYCIN) 100 MG capsule Take 1 capsule (100 mg total) by mouth 2 (two) times daily. 01/16/14   Nelva Nay, PA-C  lithium  carbonate 300 MG capsule Take 600 mg by mouth at bedtime.  10/17/13   [provider]  Multiple Vitamin (MULTIVITAMIN WITH MINERALS) TABS Take 1 tablet by mouth daily.    [provider]  PRISTIQ 100 MG 24 hr tablet Take 100 mg by mouth daily.  10/15/13   [provider]                                                                                                                                    Past Surgical History No past surgical history on file. Family History Family History  Problem Relation Age of Onset  . Cancer Mother   . Diabetes Mother   . Hypertension Mother   . Cancer Father     Social History Social History   Tobacco Use  . Smoking status: Current Every Day Smoker    Packs/day: 2.00  Years: 20.00    Pack years: 40.00    Types: Cigarettes  Substance Use Topics  . Alcohol use: Not on file  . Drug use: Yes    Comment: heroin   Allergies Patient has no known allergies.  Review of Systems Review of Systems All other systems are reviewed and are negative for acute change except as noted in the HPI  Physical Exam Vital Signs  I have reviewed the triage vital signs BP 100/76   Pulse 82   Resp 11   SpO2 98%  temp: 95.3 orally  Physical Exam  Constitutional: He is oriented to person, place, and time. He appears well-developed and well-nourished. No distress.  HENT:  Head: Normocephalic and atraumatic.  Nose: Nose normal.  Eyes: Pupils are equal, round, and reactive to light. Conjunctivae and EOM are normal. Right eye exhibits no discharge. Left eye exhibits no discharge. No scleral icterus.  Pupils 4 mm, equal and reactive  Neck: Normal range of motion. Neck supple.  Cardiovascular: Normal rate and regular rhythm. Exam reveals no gallop and no friction rub.  No murmur heard. Pulmonary/Chest: Effort normal and breath sounds normal. No stridor. No respiratory distress. He has no rales.  Abdominal: Soft. He exhibits no distension.  There is no tenderness.  Musculoskeletal: He exhibits no edema or tenderness.  Neurological: He is alert and oriented to person, place, and time.  Skin: Skin is dry. No rash noted. He is not diaphoretic. No erythema.  Cold to the touch  Psychiatric: He has a normal mood and affect.  Vitals reviewed.   ED Results and Treatments Labs (all labs ordered are listed, but only abnormal results are displayed) Labs Reviewed  COMPREHENSIVE METABOLIC PANEL - Abnormal; Notable for the following components:      Result Value   Glucose, Bld 148 (*)    AST 61 (*)    ALT 76 (*)    All other components within normal limits  ACETAMINOPHEN LEVEL - Abnormal; Notable for the following components:   Acetaminophen (Tylenol), Serum <10 (*)    All other components within normal limits  CBC WITH DIFFERENTIAL/PLATELET - Abnormal; Notable for the following components:   Abs Immature Granulocytes 0.08 (*)    All other components within normal limits  CBG MONITORING, ED - Abnormal; Notable for the following components:   Glucose-Capillary 106 (*)    All other components within normal limits  SALICYLATE LEVEL  ETHANOL  RAPID URINE DRUG SCREEN, HOSP PERFORMED  RAPID HIV SCREEN (HIV 1/2 AB+AG)  HEPATITIS PANEL, ACUTE  ACETAMINOPHEN LEVEL                                                                                                                         EKG  EKG Interpretation  Date/Time:  Saturday August 01 2018 05:23:09 EST Ventricular Rate:  91 PR Interval:    QRS Duration: 96 QT Interval:  375 QTC Calculation: 462 R Axis:   124 Text Interpretation:  Sinus rhythm  Right axis deviation No significant change since last tracing Confirmed by Drema Pryardama, Pedro [phone]) on 08/01/2018 6:37:32 AM      Radiology Dg Chest 2 View  Result Date: 08/01/2018 CLINICAL DATA:  Overdose.  Vomiting. EXAM: CHEST - 2 VIEW COMPARISON:  Chest CT 11/30/2013 FINDINGS: The heart size and mediastinal contours are within  normal limits. Both lungs are clear. The visualized skeletal structures are unremarkable. IMPRESSION: No active cardiopulmonary disease. Electronically Signed   By: Deatra RobinsonKevin  Herman M.D.   On: 08/01/2018 06:15   Pertinent labs & imaging results that were available during my care of the patient were reviewed by me and considered in my medical decision making (see chart for details).  Medications Ordered in ED Medications  sodium chloride 0.9 % bolus 1,000 mL (1,000 mLs Intravenous New Bag/Given 08/01/18 0636)                                                                                                                                    Procedures Procedures  (including critical care time)  Medical Decision Making / ED Course I have reviewed the nursing notes for this encounter and the patient's prior records (if available in EHR or on provided paperwork).    Heroin overdose who apparently received Narcan and CPR by bystanders.  Currently awake and alert x4.  No acute complaints other than being cold at this time.  OD labs obtained.  EKG nonischemic. Trop neg  Chest x-ray reassuring.  Other screening labs reassuring and without leukocytosis or anemia.  No significant electrolyte derangements or renal insufficiency.  Salicylate and acetaminophen negative. Will get 4hr acetaminophen.  Patient care turned over to Dr Freida BusmanAllen at 0800. Patient case and results discussed in detail; please see their note for further ED managment.      Final Clinical Impression(s) / ED Diagnoses Final diagnoses:  Emesis  Accidental overdose of heroin, initial encounter [facility](HCC)      This chart was dictated using voice recognition software.  Despite best efforts to proofread,  errors can occur which can change the documentation meaning.   Nira Connardama, Pedro Eduardo, MD 08/01/18 [phone]

## 2018-08-01 NOTE — ED Notes (Signed)
He is easily aroused and is in no distress. He denies any urge to void at this time.

## 2018-08-01 NOTE — ED Notes (Signed)
HIV lab results are nonreactive.

## 2018-08-01 NOTE — ED Notes (Signed)
Patient has been provided blue scrubs to change into and has been given plastic bag for belongings. Patient is now attempting to contact someone using ED phone at nurses station.

## 2018-08-03 LAB — HEPATITIS PANEL, ACUTE
HCV Ab: >11 s/co ratio — ABNORMAL HIGH (ref 0.0–0.9)
HEP B S AG: NEGATIVE
Hep A IgM: NEGATIVE
Hep B C IgM: NEGATIVE

## 2018-10-05 ENCOUNTER — Other Ambulatory Visit: Payer: Self-pay

## 2018-10-05 ENCOUNTER — Emergency Department (HOSPITAL_COMMUNITY)
Admission: EM | Admit: 2018-10-05 | Discharge: 2018-10-05 | Disposition: A | Discharge status: Home / Self Care | Payer: Self-pay | Attending: Emergency Medicine | Admitting: Emergency Medicine

## 2018-10-05 ENCOUNTER — Encounter (HOSPITAL_COMMUNITY): Payer: Self-pay | Admitting: Emergency Medicine

## 2018-10-05 DIAGNOSIS — R109 Unspecified abdominal pain: Secondary | ICD-10-CM | POA: Insufficient documentation

## 2018-10-05 DIAGNOSIS — Z5321 Procedure and treatment not carried out due to patient leaving prior to being seen by health care provider: Secondary | ICD-10-CM | POA: Insufficient documentation

## 2018-10-05 HISTORY — DX: Bipolar disorder, unspecified: F31.9

## 2018-10-05 HISTORY — DX: Acute pancreatitis without necrosis or infection, unspecified: K85.90

## 2018-10-05 LAB — COMPREHENSIVE METABOLIC PANEL
ALT: 127 U/L — AB (ref 0–44)
ANION GAP: 8 (ref 5–15)
AST: 109 U/L — ABNORMAL HIGH (ref 15–41)
Albumin: 4.1 g/dL (ref 3.5–5.0)
Alkaline Phosphatase: 57 U/L (ref 38–126)
BUN: 16 mg/dL (ref 6–20)
CALCIUM: 8.8 mg/dL — AB (ref 8.9–10.3)
CHLORIDE: 105 mmol/L (ref 98–111)
CO2: 26 mmol/L (ref 22–32)
CREATININE: 0.8 mg/dL (ref 0.61–1.24)
GFR calc non Af Amer: >60 mL/min (ref >60)
Glucose, Bld: 128 mg/dL — ABNORMAL HIGH (ref 70–99)
Potassium: 3.4 mmol/L — ABNORMAL LOW (ref 3.5–5.1)
SODIUM: 139 mmol/L (ref 135–145)
Total Bilirubin: 1.8 mg/dL — ABNORMAL HIGH (ref 0.3–1.2)
Total Protein: 6.7 g/dL (ref 6.5–8.1)

## 2018-10-05 LAB — LIPASE, BLOOD: LIPASE: 38 U/L (ref 11–51)

## 2018-10-05 LAB — CBC
HEMATOCRIT: 49.4 % (ref 39.0–52.0)
Hemoglobin: 16.7 g/dL (ref 13.0–17.0)
MCH: 31.4 pg (ref 26.0–34.0)
MCHC: 33.8 g/dL (ref 30.0–36.0)
MCV: 92.9 fL (ref 80.0–100.0)
NRBC: 0 % (ref 0.0–0.2)
PLATELETS: 263 10*3/uL (ref 150–400)
RBC: 5.32 MIL/uL (ref 4.22–5.81)
RDW: 12.8 % (ref 11.5–15.5)
WBC: 9.8 10*3/uL (ref 4.0–10.5)

## 2018-10-05 MED ORDER — SODIUM CHLORIDE 0.9% FLUSH: 3.0000 mL | Freq: Once | INTRAVENOUS | Status: DC

## 2018-10-05 NOTE — ED Notes (Addendum)
Pt called for a room, no answer x 2. 

## 2018-10-05 NOTE — ED Notes (Signed)
No answer for room x3 

## 2018-10-05 NOTE — ED Triage Notes (Signed)
Pt reports having abdominal pain that started at 0400 this morning with pain around umbilicus. Pt denies any nausea or vomiting.

## 2018-10-05 NOTE — ED Notes (Signed)
Pt was called for a room, no answer x 1.

## 2018-10-11 ENCOUNTER — Encounter (HOSPITAL_COMMUNITY): Payer: Self-pay | Admitting: *Deleted

## 2018-10-11 ENCOUNTER — Emergency Department (HOSPITAL_COMMUNITY)
Admission: EM | Admit: 2018-10-11 | Discharge: 2018-10-11 | Disposition: A | Discharge status: Home / Self Care | Payer: Self-pay | Attending: Emergency Medicine | Admitting: Emergency Medicine

## 2018-10-11 ENCOUNTER — Other Ambulatory Visit: Payer: Self-pay

## 2018-10-11 DIAGNOSIS — R1013 Epigastric pain: Secondary | ICD-10-CM | POA: Insufficient documentation

## 2018-10-11 DIAGNOSIS — Z79899 Other long term (current) drug therapy: Secondary | ICD-10-CM | POA: Insufficient documentation

## 2018-10-11 DIAGNOSIS — F17210 Nicotine dependence, cigarettes, uncomplicated: Secondary | ICD-10-CM | POA: Insufficient documentation

## 2018-10-11 HISTORY — DX: Unspecified viral hepatitis C without hepatic coma: B19.20

## 2018-10-11 LAB — COMPREHENSIVE METABOLIC PANEL
ALBUMIN: 3.9 g/dL (ref 3.5–5.0)
ALT: 76 U/L — ABNORMAL HIGH (ref 0–44)
ANION GAP: 7 (ref 5–15)
AST: 65 U/L — AB (ref 15–41)
Alkaline Phosphatase: 53 U/L (ref 38–126)
BUN: 15 mg/dL (ref 6–20)
CHLORIDE: 105 mmol/L (ref 98–111)
CO2: 28 mmol/L (ref 22–32)
Calcium: 8.7 mg/dL — ABNORMAL LOW (ref 8.9–10.3)
Creatinine, Ser: 0.97 mg/dL (ref 0.61–1.24)
GFR calc Af Amer: >60 mL/min (ref >60)
GFR calc non Af Amer: >60 mL/min (ref >60)
GLUCOSE: 106 mg/dL — AB (ref 70–99)
POTASSIUM: 3.7 mmol/L (ref 3.5–5.1)
SODIUM: 140 mmol/L (ref 135–145)
Total Bilirubin: 0.6 mg/dL (ref 0.3–1.2)
Total Protein: 6.5 g/dL (ref 6.5–8.1)

## 2018-10-11 LAB — CBC
HEMATOCRIT: 48.2 % (ref 39.0–52.0)
HEMOGLOBIN: 16.4 g/dL (ref 13.0–17.0)
MCH: 31.8 pg (ref 26.0–34.0)
MCHC: 34 g/dL (ref 30.0–36.0)
MCV: 93.6 fL (ref 80.0–100.0)
Platelets: 252 10*3/uL (ref 150–400)
RBC: 5.15 MIL/uL (ref 4.22–5.81)
RDW: 12.6 % (ref 11.5–15.5)
WBC: 10.2 10*3/uL (ref 4.0–10.5)
nRBC: 0 % (ref 0.0–0.2)

## 2018-10-11 LAB — LIPASE, BLOOD: LIPASE: 37 U/L (ref 11–51)

## 2018-10-11 MED ORDER — SODIUM CHLORIDE 0.9% FLUSH: 3.0000 mL | Freq: Once | INTRAVENOUS | Status: DC

## 2018-10-11 NOTE — Discharge Instructions (Addendum)
You were seen in the ER for resolved abdominal pain.  Your labs today are normal.  Exam is benign.  I suspect the symptoms are from either gastritis, acid reflux or a resolved viral process.  Start taking over the counter omeprazole 40 mg every morning on an empty stomach, wait 20 to 30 minutes before eating or drinking.  Avoid irritating foods and drinks such as alcohol, acidic foods, ibuprofen, alcohol.  Follow-up with primary care doctor for continued discomfort.  Return to the ER for worsening pain, localized abdominal pain to your right upper or right lower abdomen, fevers, inability to tolerate fluids, blood in your vomit or stools.

## 2018-10-11 NOTE — ED Triage Notes (Signed)
Upper mid abd pain for several days. Has history of Pancreatitis and ? Gall bladder problems. No emesis, decreased appetite

## 2018-10-11 NOTE — ED Provider Notes (Signed)
[facility]-EMERGENCY DEPT Provider Note   CSN: [id number] Arrival date & time: 10/11/18  1714     History   Chief Complaint Chief Complaint  Patient presents with  . Abdominal Pain    HPI Luke Harper is a 53 y.o. male with history of hepatitis C, bipolar disorder, remote opioid and EtOH abuse, is here for evaluation of resolved abdominal pain.  States he came to the ER on Monday but left without being seen due to wait times.  He tried to go back to work today but his employer told him he needed a work note to be able to return to work.  States his abdominal pain began Sunday night.  It was around his midsection, stabbing sharp pain radiating to his epigastrium.  States several years ago he was told he had pancreatitis and the pain appeared to be similar.  He left the ER and googled treatment of pancreatitis and did a liquid diet for the last 1 week.  He also took naproxen and Tylenol.  Currently his pain is a "soreness" but much improved.  He denies associated fevers, chills, nausea, vomiting, changes in bowel movements, urinary symptoms.  He had no associated chest pain or shortness of breath.  He had no hematochezia or melena.  He has been 7 years clean of EtOH and opioids.  No abdominal surgeries. HPI  Past Medical History:  Diagnosis Date  . Anxiety   . Bipolar disease, chronic (HCC)   . Depression   . Hepatitis C   . Pancreatitis     There are no active problems to display for this patient.   History reviewed. No pertinent surgical history.      Home Medications    Prior to Admission medications   Medication Sig Start Date End Date Taking? Authorizing Provider  B Complex-C (B-COMPLEX WITH VITAMIN C) tablet Take 1 tablet by mouth daily.   Yes [provider]  ibuprofen (ADVIL,MOTRIN) 200 MG tablet Take 200 mg by mouth every 6 (six) hours as needed for moderate pain.   Yes [provider]  naproxen (NAPROSYN) 250 MG tablet Take 250  mg by mouth 2 (two) times daily as needed for moderate pain.   Yes [provider]  QUEtiapine (SEROQUEL) 400 MG tablet Take 400 mg by mouth at bedtime. 09/30/18  Yes [provider]  amoxicillin-clavulanate (AUGMENTIN) 875-125 MG per tablet Take 1 tablet by mouth 2 (two) times daily. Patient not taking: Reported on 10/11/2018 01/16/14   Nelva Nay, PA-C  amphetamine-dextroamphetamine (ADDERALL) 20 MG tablet Take 20 mg by mouth 2 (two) times daily as needed (adhd).  10/01/18   [provider]  cefTRIAXone (ROCEPHIN) 1 G injection Inject 1 g into the muscle once. Patient not taking: Reported on 10/11/2018 01/17/14   Shade Flood, MD  clonazePAM (KLONOPIN) 1 MG tablet Take 1 mg by mouth 3 (three) times daily as needed for anxiety.  10/15/13   [provider]  doxycycline (VIBRAMYCIN) 100 MG capsule Take 1 capsule (100 mg total) by mouth 2 (two) times daily. Patient not taking: Reported on 10/11/2018 01/16/14   Nelva Nay, PA-C  Multiple Vitamin (MULTIVITAMIN WITH MINERALS) TABS Take 1 tablet by mouth daily.    [provider]    Family History Family History  Problem Relation Age of Onset  . Cancer Mother   . Diabetes Mother   . Hypertension Mother   . Cancer Father     Social History  Social History   Tobacco Use  . Smoking status: Current Every Day Smoker    Packs/day: 2.00    Years: 20.00    Pack years: 40.00    Types: Cigarettes  . Smokeless tobacco: Never Used  Substance Use Topics  . Alcohol use: Yes    Comment: occasionally  . Drug use: Yes    Comment: heroin     Allergies   Patient has no known allergies.   Review of Systems Review of Systems  Gastrointestinal: Positive for abdominal pain (resolved).  All other systems reviewed and are negative.    Physical Exam Updated Vital Signs BP 109/71   Pulse 73   Temp 97.9 F (36.6 C) (Oral)   Resp 16   Ht 5\' 10"  (1.778 m)   Wt 83.9 kg   SpO2 95%   BMI 26.54  kg/m   Physical Exam Vitals signs and nursing note reviewed.  Constitutional:      Appearance: He is well-developed.     Comments: Non toxic.  HENT:     Head: Normocephalic and atraumatic.     Nose: Nose normal.  Eyes:     Conjunctiva/sclera: Conjunctivae normal.     Pupils: Pupils are equal, round, and reactive to light.  Neck:     Musculoskeletal: Normal range of motion.  Cardiovascular:     Rate and Rhythm: Normal rate and regular rhythm.     Heart sounds: Normal heart sounds.  Pulmonary:     Effort: Pulmonary effort is normal.     Breath sounds: Normal breath sounds.  Abdominal:     General: Bowel sounds are normal.     Palpations: Abdomen is soft.     Tenderness: There is no abdominal tenderness.     Comments: No G/R/R. No suprapubic or CVA tenderness. Negative Murphy's and McBurney's  Musculoskeletal: Normal range of motion.  Skin:    General: Skin is warm and dry.     Capillary Refill: Capillary refill takes less than 2 seconds.  Neurological:     Mental Status: He is alert and oriented to person, place, and time.  Psychiatric:        Behavior: Behavior normal.        Thought Content: Thought content normal.        Judgment: Judgment normal.      ED Treatments / Results  Labs (all labs ordered are listed, but only abnormal results are displayed) Labs Reviewed  COMPREHENSIVE METABOLIC PANEL - Abnormal; Notable for the following components:      Result Value   Glucose, Bld 106 (*)    Calcium 8.7 (*)    AST 65 (*)    ALT 76 (*)    All other components within normal limits  LIPASE, BLOOD  CBC    EKG None  Radiology No results found.  Procedures Procedures (including critical care time)  Medications Ordered in ED Medications  sodium chloride flush (NS) 0.9 % injection 3 mL (3 mLs Intravenous Not Given 10/11/18 1846)     Initial Impression / Assessment and Plan / ED Course  I have reviewed the triage vital signs and the nursing  notes.  Pertinent labs & imaging results that were available during my care of the patient were reviewed by me and considered in my medical decision making (see chart for details).  Clinical Course as of Oct 12 1999  Wynelle Link Oct 11, 2018  1905 AST(!): 65 [CG]  1905 ALT(!): 76 [CG]    Clinical Course  User Index [CG] Liberty HandyGibbons,  J, PA-C   53 year old with reported previous history of pancreatitis is here for resolved epigastric abdominal pain.  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  Labs obtain 1 week ago in the ER reviewed and remarkable for mildly elevated AST/ALT.  Labs obtained today show improvement in AST/ALT however he has no abdominal tenderness.  He denies any concerning associated symptoms.  I suspect symptoms may have been from gastritis versus acid reflux versus viral process vs biliary colic.  I see no documentation of formal diagnosis of pancreatitis in the last 10 years.  I do not see indication for further emergent lab work, imaging or admission.  Would consider RUQ US if LFTs were worsening.  H/o hepatitis C but no acute symptoms of acute hepatitis today. We will discharge with PPI, diet changes.  I discussed possibility of biliary colic.  Recommended f/u with PCP/GI if persistent abdominal pain for outpatient RUQ US.  Return precautions were given.  Patient is in agreement with this.  Final Clinical Impressions(s) / ED Diagnoses   Final diagnoses:  Epigastric abdominal pain    ED Discharge Orders    None       Jerrell MylarGibbons,  J, PA-C 10/11/18 2001    Mesner, Barbara CowerJason, MD 10/12/18 [email or web address]

## 2019-09-02 ENCOUNTER — Ambulatory Visit: Payer: HRSA Program | Attending: Internal Medicine

## 2019-09-02 DIAGNOSIS — S70322A Blister (nonthermal), left thigh, initial encounter: Secondary | ICD-10-CM | POA: Diagnosis not present

## 2019-09-02 DIAGNOSIS — Z20828 Contact with and (suspected) exposure to other viral communicable diseases: Secondary | ICD-10-CM | POA: Diagnosis not present

## 2019-09-02 DIAGNOSIS — Z20822 Contact with and (suspected) exposure to covid-19: Secondary | ICD-10-CM

## 2019-09-02 DIAGNOSIS — L089 Local infection of the skin and subcutaneous tissue, unspecified: Secondary | ICD-10-CM | POA: Diagnosis not present

## 2019-09-02 DIAGNOSIS — J029 Acute pharyngitis, unspecified: Secondary | ICD-10-CM | POA: Diagnosis not present

## 2019-09-03 LAB — NOVEL CORONAVIRUS, NAA: SARS-CoV-2, NAA: NOT DETECTED

## 2019-10-21 DIAGNOSIS — R1110 Vomiting, unspecified: Secondary | ICD-10-CM | POA: Diagnosis not present

## 2019-10-21 DIAGNOSIS — R197 Diarrhea, unspecified: Secondary | ICD-10-CM | POA: Diagnosis not present

## 2019-10-25 DIAGNOSIS — R197 Diarrhea, unspecified: Secondary | ICD-10-CM | POA: Diagnosis not present

## 2019-11-27 DIAGNOSIS — Z23 Encounter for immunization: Secondary | ICD-10-CM | POA: Diagnosis not present

## 2020-03-31 ENCOUNTER — Encounter (HOSPITAL_COMMUNITY): Payer: Self-pay

## 2020-03-31 ENCOUNTER — Emergency Department (HOSPITAL_COMMUNITY): Payer: Self-pay

## 2020-03-31 ENCOUNTER — Observation Stay (HOSPITAL_COMMUNITY)
Admission: EM | Admit: 2020-03-31 | Discharge: 2020-04-02 | Disposition: A | Discharge status: Home / Self Care | Payer: Self-pay | Attending: Internal Medicine | Admitting: Internal Medicine

## 2020-03-31 ENCOUNTER — Other Ambulatory Visit: Payer: Self-pay

## 2020-03-31 DIAGNOSIS — F329 Major depressive disorder, single episode, unspecified: Secondary | ICD-10-CM | POA: Diagnosis present

## 2020-03-31 DIAGNOSIS — T401X1A Poisoning by heroin, accidental (unintentional), initial encounter: Principal | ICD-10-CM | POA: Insufficient documentation

## 2020-03-31 DIAGNOSIS — R0902 Hypoxemia: Secondary | ICD-10-CM | POA: Insufficient documentation

## 2020-03-31 DIAGNOSIS — F319 Bipolar disorder, unspecified: Secondary | ICD-10-CM | POA: Insufficient documentation

## 2020-03-31 DIAGNOSIS — B182 Chronic viral hepatitis C: Secondary | ICD-10-CM | POA: Diagnosis present

## 2020-03-31 DIAGNOSIS — Z79899 Other long term (current) drug therapy: Secondary | ICD-10-CM | POA: Insufficient documentation

## 2020-03-31 DIAGNOSIS — F17210 Nicotine dependence, cigarettes, uncomplicated: Secondary | ICD-10-CM | POA: Insufficient documentation

## 2020-03-31 DIAGNOSIS — N179 Acute kidney failure, unspecified: Secondary | ICD-10-CM | POA: Diagnosis present

## 2020-03-31 DIAGNOSIS — T50901A Poisoning by unspecified drugs, medicaments and biological substances, accidental (unintentional), initial encounter: Secondary | ICD-10-CM

## 2020-03-31 DIAGNOSIS — F419 Anxiety disorder, unspecified: Secondary | ICD-10-CM | POA: Diagnosis present

## 2020-03-31 DIAGNOSIS — F1920 Other psychoactive substance dependence, uncomplicated: Secondary | ICD-10-CM | POA: Insufficient documentation

## 2020-03-31 DIAGNOSIS — F32A Depression, unspecified: Secondary | ICD-10-CM | POA: Diagnosis present

## 2020-03-31 DIAGNOSIS — D72829 Elevated white blood cell count, unspecified: Secondary | ICD-10-CM | POA: Insufficient documentation

## 2020-03-31 DIAGNOSIS — F1910 Other psychoactive substance abuse, uncomplicated: Secondary | ICD-10-CM | POA: Diagnosis present

## 2020-03-31 DIAGNOSIS — Z20822 Contact with and (suspected) exposure to covid-19: Secondary | ICD-10-CM | POA: Insufficient documentation

## 2020-03-31 DIAGNOSIS — T405X1A Poisoning by cocaine, accidental (unintentional), initial encounter: Secondary | ICD-10-CM | POA: Insufficient documentation

## 2020-03-31 LAB — TSH: TSH: 1.595 u[IU]/mL (ref 0.350–4.500)

## 2020-03-31 LAB — URINALYSIS, ROUTINE W REFLEX MICROSCOPIC
Bilirubin Urine: NEGATIVE
Glucose, UA: 50 mg/dL — AB
Ketones, ur: NEGATIVE mg/dL
Leukocytes,Ua: NEGATIVE
Nitrite: NEGATIVE
Protein, ur: 100 mg/dL — AB
Specific Gravity, Urine: 1.021 (ref 1.005–1.030)
pH: 5 (ref 5.0–8.0)

## 2020-03-31 LAB — CBC WITH DIFFERENTIAL/PLATELET
Abs Immature Granulocytes: 0.05 10*3/uL (ref 0.00–0.07)
Basophils Absolute: 0 10*3/uL (ref 0.0–0.1)
Basophils Relative: 0 %
Eosinophils Absolute: 0 10*3/uL (ref 0.0–0.5)
Eosinophils Relative: 0 %
HCT: 52.5 % — ABNORMAL HIGH (ref 39.0–52.0)
Hemoglobin: 17.6 g/dL — ABNORMAL HIGH (ref 13.0–17.0)
Immature Granulocytes: 0 %
Lymphocytes Relative: 7 %
Lymphs Abs: 1.2 10*3/uL (ref 0.7–4.0)
MCH: 31.8 pg (ref 26.0–34.0)
MCHC: 33.5 g/dL (ref 30.0–36.0)
MCV: 94.9 fL (ref 80.0–100.0)
Monocytes Absolute: 1.4 10*3/uL — ABNORMAL HIGH (ref 0.1–1.0)
Monocytes Relative: 8 %
Neutro Abs: 14 10*3/uL — ABNORMAL HIGH (ref 1.7–7.7)
Neutrophils Relative %: 85 %
Platelets: 269 10*3/uL (ref 150–400)
RBC: 5.53 MIL/uL (ref 4.22–5.81)
RDW: 12.8 % (ref 11.5–15.5)
WBC: 16.6 10*3/uL — ABNORMAL HIGH (ref 4.0–10.5)
nRBC: 0 % (ref 0.0–0.2)

## 2020-03-31 LAB — HIV ANTIBODY (ROUTINE TESTING W REFLEX): HIV Screen 4th Generation wRfx: NONREACTIVE

## 2020-03-31 LAB — BASIC METABOLIC PANEL
Anion gap: 13 (ref 5–15)
BUN: 34 mg/dL — ABNORMAL HIGH (ref 6–20)
CO2: 24 mmol/L (ref 22–32)
Calcium: 7.9 mg/dL — ABNORMAL LOW (ref 8.9–10.3)
Chloride: 103 mmol/L (ref 98–111)
Creatinine, Ser: 1.91 mg/dL — ABNORMAL HIGH (ref 0.61–1.24)
GFR calc Af Amer: 45 mL/min — ABNORMAL LOW (ref >60)
GFR calc non Af Amer: 39 mL/min — ABNORMAL LOW (ref >60)
Glucose, Bld: 106 mg/dL — ABNORMAL HIGH (ref 70–99)
Potassium: 5.7 mmol/L — ABNORMAL HIGH (ref 3.5–5.1)
Sodium: 140 mmol/L (ref 135–145)

## 2020-03-31 LAB — RAPID URINE DRUG SCREEN, HOSP PERFORMED
Amphetamines: POSITIVE — AB
Barbiturates: NOT DETECTED
Benzodiazepines: NOT DETECTED
Cocaine: POSITIVE — AB
Opiates: POSITIVE — AB
Tetrahydrocannabinol: NOT DETECTED

## 2020-03-31 LAB — SARS CORONAVIRUS 2 BY RT PCR (HOSPITAL ORDER, PERFORMED IN ~~LOC~~ HOSPITAL LAB): SARS Coronavirus 2: NEGATIVE

## 2020-03-31 LAB — CALCIUM: Calcium: 8.3 mg/dL — ABNORMAL LOW (ref 8.9–10.3)

## 2020-03-31 LAB — MAGNESIUM: Magnesium: 2.7 mg/dL — ABNORMAL HIGH (ref 1.7–2.4)

## 2020-03-31 LAB — HEMOGLOBIN A1C
Hgb A1c MFr Bld: 5.8 % — ABNORMAL HIGH (ref 4.8–5.6)
Mean Plasma Glucose: 119.76 mg/dL

## 2020-03-31 LAB — CBG MONITORING, ED: Glucose-Capillary: 120 mg/dL — ABNORMAL HIGH (ref 70–99)

## 2020-03-31 LAB — PROCALCITONIN: Procalcitonin: 17.8 ng/mL

## 2020-03-31 LAB — LACTIC ACID, PLASMA: Lactic Acid, Venous: 1.3 mmol/L (ref 0.5–1.9)

## 2020-03-31 LAB — BRAIN NATRIURETIC PEPTIDE
B Natriuretic Peptide: 101.6 pg/mL — ABNORMAL HIGH (ref 0.0–100.0)
B Natriuretic Peptide: 107.2 pg/mL — ABNORMAL HIGH (ref 0.0–100.0)

## 2020-03-31 LAB — ETHANOL: Alcohol, Ethyl (B): <10 mg/dL (ref <10)

## 2020-03-31 LAB — PHOSPHORUS: Phosphorus: 7.1 mg/dL — ABNORMAL HIGH (ref 2.5–4.6)

## 2020-03-31 MED ORDER — ONDANSETRON HCL 4 MG/2ML IJ SOLN: 4.0000 mg | Freq: Four times a day (QID) | INTRAMUSCULAR | Status: DC | PRN

## 2020-03-31 MED ORDER — NALOXONE HCL 2 MG/2ML IJ SOSY: 1.0000 mg | PREFILLED_SYRINGE | Freq: Once | INTRAMUSCULAR | Status: DC

## 2020-03-31 MED ORDER — OXYCODONE HCL 5 MG PO TABS: 5.0000 mg | ORAL_TABLET | ORAL | Status: DC | PRN

## 2020-03-31 MED ORDER — SODIUM CHLORIDE 0.9 % IV BOLUS (SEPSIS)
1000.0000 mL | Freq: Once | INTRAVENOUS | Status: AC
  Administered 2020-03-31: 1000 mL via INTRAVENOUS

## 2020-03-31 MED ORDER — SODIUM CHLORIDE 0.9 % IV SOLN: INTRAVENOUS | Status: AC

## 2020-03-31 MED ORDER — ACETAMINOPHEN 325 MG PO TABS: 650.0000 mg | ORAL_TABLET | Freq: Four times a day (QID) | ORAL | Status: DC | PRN

## 2020-03-31 MED ORDER — CLONAZEPAM 1 MG PO TABS: 1.0000 mg | ORAL_TABLET | Freq: Three times a day (TID) | ORAL | Status: DC | PRN

## 2020-03-31 MED ORDER — ARIPIPRAZOLE 10 MG PO TABS
5.0000 mg | ORAL_TABLET | Freq: Every day | ORAL | Status: DC
  Administered 2020-04-01: 5 mg via ORAL
  Filled 2020-03-31 (×2): qty 1

## 2020-03-31 MED ORDER — FLUOXETINE HCL 20 MG PO CAPS
40.0000 mg | ORAL_CAPSULE | Freq: Every day | ORAL | Status: DC
  Administered 2020-04-01: 40 mg via ORAL
  Filled 2020-03-31 (×2): qty 2

## 2020-03-31 MED ORDER — TRAZODONE HCL 50 MG PO TABS
100.0000 mg | ORAL_TABLET | Freq: Every day | ORAL | Status: DC
  Administered 2020-03-31 – 2020-04-01 (×2): 100 mg via ORAL
  Filled 2020-03-31 (×2): qty 2

## 2020-03-31 MED ORDER — PIPERACILLIN-TAZOBACTAM 3.375 G IVPB
3.3750 g | Freq: Three times a day (TID) | INTRAVENOUS | Status: DC
  Administered 2020-03-31 – 2020-04-02 (×6): 3.375 g via INTRAVENOUS
  Filled 2020-03-31 (×7): qty 50

## 2020-03-31 MED ORDER — SODIUM CHLORIDE 0.9 % IV SOLN
1000.0000 mL | INTRAVENOUS | Status: DC
  Administered 2020-03-31 – 2020-04-02 (×2): 1000 mL via INTRAVENOUS

## 2020-03-31 MED ORDER — SENNOSIDES-DOCUSATE SODIUM 8.6-50 MG PO TABS: 1.0000 | ORAL_TABLET | Freq: Every evening | ORAL | Status: DC | PRN

## 2020-03-31 MED ORDER — NALOXONE HCL 0.4 MG/ML IJ SOLN
0.2000 mg | Freq: Once | INTRAMUSCULAR | Status: AC
  Administered 2020-03-31: 0.2 mg via INTRAVENOUS
  Filled 2020-03-31: qty 1

## 2020-03-31 MED ORDER — DOCUSATE SODIUM 100 MG PO CAPS
100.0000 mg | ORAL_CAPSULE | Freq: Two times a day (BID) | ORAL | Status: DC
  Administered 2020-03-31 – 2020-04-01 (×3): 100 mg via ORAL
  Filled 2020-03-31 (×4): qty 1

## 2020-03-31 MED ORDER — HEPARIN SODIUM (PORCINE) 5000 UNIT/ML IJ SOLN
5000.0000 [IU] | Freq: Three times a day (TID) | INTRAMUSCULAR | Status: DC
  Administered 2020-03-31 – 2020-04-02 (×5): 5000 [IU] via SUBCUTANEOUS
  Filled 2020-03-31 (×5): qty 1

## 2020-03-31 MED ORDER — LAMOTRIGINE 100 MG PO TABS
100.0000 mg | ORAL_TABLET | Freq: Every day | ORAL | Status: DC
  Administered 2020-04-01: 100 mg via ORAL
  Filled 2020-03-31 (×2): qty 1

## 2020-03-31 MED ORDER — HYDRALAZINE HCL 20 MG/ML IJ SOLN: 10.0000 mg | INTRAMUSCULAR | Status: DC | PRN

## 2020-03-31 MED ORDER — ADULT MULTIVITAMIN W/MINERALS CH
1.0000 | ORAL_TABLET | Freq: Every day | ORAL | Status: DC
  Administered 2020-04-01: 1 via ORAL
  Filled 2020-03-31 (×2): qty 1

## 2020-03-31 MED ORDER — MAGNESIUM CITRATE PO SOLN: 1.0000 | Freq: Once | ORAL | Status: DC | PRN

## 2020-03-31 MED ORDER — ACETAMINOPHEN 650 MG RE SUPP: 650.0000 mg | Freq: Four times a day (QID) | RECTAL | Status: DC | PRN

## 2020-03-31 MED ORDER — B COMPLEX-C PO TABS
1.0000 | ORAL_TABLET | Freq: Every day | ORAL | Status: DC
  Administered 2020-04-01: 1 via ORAL
  Filled 2020-03-31 (×2): qty 1

## 2020-03-31 MED ORDER — SODIUM CHLORIDE 0.9% FLUSH
3.0000 mL | Freq: Two times a day (BID) | INTRAVENOUS | Status: DC
  Administered 2020-03-31: 3 mL via INTRAVENOUS

## 2020-03-31 MED ORDER — NALOXONE HCL 0.4 MG/ML IJ SOLN: 0.1000 mg | Freq: Once | INTRAMUSCULAR | Status: DC

## 2020-03-31 MED ORDER — SORBITOL 70 % SOLN
30.0000 mL | Freq: Every day | Status: DC | PRN
  Filled 2020-03-31: qty 30

## 2020-03-31 MED ORDER — ALBUTEROL SULFATE (2.5 MG/3ML) 0.083% IN NEBU: 2.5000 mg | INHALATION_SOLUTION | RESPIRATORY_TRACT | Status: DC | PRN

## 2020-03-31 MED ORDER — ONDANSETRON HCL 4 MG PO TABS: 4.0000 mg | ORAL_TABLET | Freq: Four times a day (QID) | ORAL | Status: DC | PRN

## 2020-03-31 NOTE — ED Notes (Signed)
ED TO INPATIENT HANDOFF REPORT  Name/Age/Gender Luke Harper 54 y.o. male  Code Status    Code Status Orders  (From admission, onward)         Start     Ordered   03/31/20 1601  Full code  Continuous        03/31/20 1603        Code Status History    This patient has a current code status but no historical code status.   Advance Care Planning Activity      Home/SNF/Other Home  Chief Complaint Overdose [T50.901A]  Level of Care/Admitting Diagnosis ED Disposition    ED Disposition Condition Comment   Admit  Hospital Area: [facility] [[id number]]  Level of Care: Med-Surg [16]  Covid Evaluation: Confirmed COVID Negative  Diagnosis: Overdose [[id number]]  Admitting Physician: Felipa Furnace  Attending Physician: Kendell Bane [phone]       Medical History Past Medical History:  Diagnosis Date  . Anxiety   . Bipolar disease, chronic (HCC)   . Depression   . Hepatitis C   . Pancreatitis     Allergies No Known Allergies  IV Location/Drains/Wounds Patient Lines/Drains/Airways Status    Active Line/Drains/Airways    Name Placement date Placement time Site Days   Peripheral IV 03/31/20 Left Antecubital 03/31/20  --  Antecubital  less than 1          Labs/Imaging Results for orders placed or performed during the hospital encounter of 03/31/20 (from the past 48 hour(s))  CBG monitoring, ED     Status: Abnormal   Collection Time: 03/31/20  1:14 PM  Result Value Ref Range   Glucose-Capillary 120 (H) 70 - 99 mg/dL    Comment: Glucose reference range applies only to samples taken after fasting for at least 8 hours.  CBC with Differential/Platelet     Status: Abnormal   Collection Time: 03/31/20  1:32 PM  Result Value Ref Range   WBC 16.6 (H) 4.0 - 10.5 K/uL   RBC 5.53 4.22 - 5.81 MIL/uL   Hemoglobin 17.6 (H) 13.0 - 17.0 g/dL   HCT 35.3 (H) 39 - 52 %   MCV 94.9 80.0 - 100.0 fL   MCH 31.8 26.0 - 34.0 pg   MCHC 33.5 30.0  - 36.0 g/dL   RDW 29.9 24.2 - 68.3 %   Platelets 269 150 - 400 K/uL   nRBC 0.0 0.0 - 0.2 %   Neutrophils Relative % 85 %   Neutro Abs 14.0 (H) 1.7 - 7.7 K/uL   Lymphocytes Relative 7 %   Lymphs Abs 1.2 0.7 - 4.0 K/uL   Monocytes Relative 8 %   Monocytes Absolute 1.4 (H) 0 - 1 K/uL   Eosinophils Relative 0 %   Eosinophils Absolute 0.0 0 - 0 K/uL   Basophils Relative 0 %   Basophils Absolute 0.0 0 - 0 K/uL   Immature Granulocytes 0 %   Abs Immature Granulocytes 0.05 0.00 - 0.07 K/uL    Comment: Performed at [facility] [address], [place], [place] [address]  Basic metabolic panel     Status: Abnormal   Collection Time: 03/31/20  1:32 PM  Result Value Ref Range   Sodium 140 135 - 145 mmol/L   Potassium 5.7 (H) 3.5 - 5.1 mmol/L   Chloride 103 98 - 111 mmol/L   CO2 24 22 - 32 mmol/L   Glucose, Bld 106 (H) 70 - 99 mg/dL  Comment: Glucose reference range applies only to samples taken after fasting for at least 8 hours.   BUN 34 (H) 6 - 20 mg/dL   Creatinine, Ser 1.611.91 (H) 0.61 - 1.24 mg/dL   Calcium 7.9 (L) 8.9 - 10.3 mg/dL   GFR calc non Af Amer 39 (L) >60 mL/min   GFR calc Af Amer 45 (L) >60 mL/min   Anion gap 13 5 - 15    Comment: Performed at [facility] [facility], [address] [place], [place] [address]  Brain natriuretic peptide     Status: Abnormal   Collection Time: 03/31/20  1:32 PM  Result Value Ref Range   B Natriuretic Peptide 101.6 (H) 0.0 - 100.0 pg/mL    Comment: Performed at [facility], [address] [place], [place] [address]  Ethanol     Status: None   Collection Time: 03/31/20  1:32 PM  Result Value Ref Range   Alcohol, Ethyl (B) <10 <10 mg/dL    Comment: (NOTE) Lowest detectable limit for serum alcohol is 10 mg/dL.  For medical purposes only. Performed at [facility], [address] [place], [place] [address]   SARS Coronavirus 2 by RT PCR (hospital order, performed in [facility] lab) Nasopharyngeal Nasopharyngeal Swab     Status: None   Collection Time: 03/31/20  1:38 PM   Specimen: Nasopharyngeal Swab  Result Value Ref Range   SARS Coronavirus 2 NEGATIVE NEGATIVE    Comment: (NOTE) SARS-CoV-2 target nucleic acids are NOT DETECTED.  The SARS-CoV-2 RNA is generally detectable in upper and lower respiratory specimens during the acute phase of infection. The lowest concentration of SARS-CoV-2 viral copies this assay can detect is 250 copies / mL. A negative result does not preclude SARS-CoV-2 infection and should not be used as the sole basis for treatment or other patient management decisions.  A negative result may occur with improper specimen collection / handling, submission of specimen other than nasopharyngeal swab, presence of viral mutation(s) within the areas targeted by this assay, and inadequate number of viral copies (<250 copies / mL). A negative result must be combined with clinical observations, patient history, and epidemiological information.  Fact Sheet for Patients:   BoilerBrush.com.cyhttps://www.fda.gov/media/136312/download  Fact Sheet for Healthcare Providers: https://pope.com/https://www.fda.gov/media/136313/download  This test is not yet approved or  cleared by the [place] FDA and has been authorized for detection and/or diagnosis of SARS-CoV-2 by FDA under an Emergency Use Authorization (EUA).  This EUA will remain in effect (meaning this test can be used) for the duration of the COVID-19 declaration under Section 564(b)(1) of the Act, 21 U.S.C. section 360bbb-3(b)(1), unless the authorization is terminated or revoked sooner.  Performed at [facility], [address] [place], [place] [address]   Rapid urine drug screen (hospital performed)     Status: Abnormal   Collection Time: 03/31/20  3:25 PM  Result Value Ref Range   Opiates POSITIVE (A) NONE DETECTED   Cocaine POSITIVE (A) NONE DETECTED   Benzodiazepines NONE  DETECTED NONE DETECTED   Amphetamines POSITIVE (A) NONE DETECTED   Tetrahydrocannabinol NONE DETECTED NONE DETECTED   Barbiturates NONE DETECTED NONE DETECTED    Comment: (NOTE) DRUG SCREEN FOR MEDICAL PURPOSES ONLY.  IF CONFIRMATION IS NEEDED FOR ANY PURPOSE, NOTIFY LAB WITHIN 5 DAYS.  LOWEST DETECTABLE LIMITS FOR URINE DRUG SCREEN Drug Class  Cutoff (ng/mL) Amphetamine and metabolites    1000 Barbiturate and metabolites    200 Benzodiazepine                 200 Tricyclics and metabolites     300 Opiates and metabolites        300 Cocaine and metabolites        300 THC                            50 Performed at [facility], [address] [place], [place] [address]   Lactic acid, plasma     Status: None   Collection Time: 03/31/20  3:59 PM  Result Value Ref Range   Lactic Acid, Venous 1.3 0.5 - 1.9 mmol/L    Comment: Performed at [facility], [address], [place], [place] [address]  Brain natriuretic peptide     Status: Abnormal   Collection Time: 03/31/20  4:01 PM  Result Value Ref Range   B Natriuretic Peptide 107.2 (H) 0.0 - 100.0 pg/mL    Comment: Performed at [facility], [address], [place], [place] [address]  TSH     Status: None   Collection Time: 03/31/20  4:01 PM  Result Value Ref Range   TSH 1.595 0.350 - 4.500 uIU/mL    Comment: Performed by a 3rd Generation assay with a functional sensitivity of <=0.01 uIU/mL. Performed at [facility], [address] [place], [place] [address]    DG Chest Portable 1 View  Result Date: 03/31/2020 CLINICAL DATA:  Dyspnea.  Drug abuse. EXAM: PORTABLE CHEST 1 VIEW COMPARISON:  08/01/2018 FINDINGS: Poor inspiration. Grossly stable normal sized heart and clear lungs. Mild lower thoracic spine degenerative changes. IMPRESSION: No acute abnormality. Electronically Signed   By: Beckie Salts M.D.   On: 03/31/2020 14:13     Pending Labs Unresulted Labs (From admission, onward) Comment          Start     Ordered   04/01/20 0500  Procalcitonin  Daily,   R      03/31/20 1558   04/01/20 0500  Basic metabolic panel  Daily,   R      03/31/20 1603   04/01/20 0500  CBC  Daily,   R      03/31/20 1603   04/01/20 0500  Protime-INR  Tomorrow morning,   R        03/31/20 1603   04/01/20 0500  APTT  Tomorrow morning,   R        03/31/20 1603   03/31/20 1634  Calcium  Once,   STAT        03/31/20 1634   03/31/20 1634  Magnesium  Once,   STAT        03/31/20 1634   03/31/20 1634  Procalcitonin  Once,   STAT        03/31/20 1634   03/31/20 1634  Phosphorus  Once,   STAT        03/31/20 1634   03/31/20 1632  Urine culture  Add-on,   AD        03/31/20 1631   03/31/20 1631  Urinalysis, Routine w reflex microscopic  Add-on,   AD        03/31/20 1631   03/31/20 1627  Culture, blood (routine x 2)  BLOOD CULTURE X 2,   R (with STAT occurrences)  03/31/20 1627   03/31/20 1601  HIV Antibody (routine testing w rflx)  (HIV Antibody (Routine testing w reflex) panel)  Once,   STAT        03/31/20 1603   03/31/20 1601  Hemoglobin A1c  Once,   STAT        03/31/20 1603          Vitals/Pain Today's Vitals   03/31/20 1515 03/31/20 1545 03/31/20 1630 03/31/20 1722  BP: (!) 111/90 (!) 114/90 (!) 119/96 (!) 118/99  Pulse: 87 88 90 90  Resp: 13 (!) 24 17 16   Temp:      TempSrc:      SpO2: 97% 96% 95% 96%  PainSc:        Isolation Precautions No active isolations  Medications Medications  sodium chloride 0.9 % bolus 1,000 mL (1,000 mLs Intravenous New Bag/Given (Non-Interop) 03/31/20 1549)    Followed by  0.9 %  sodium chloride infusion (1,000 mLs Intravenous New Bag/Given (Non-Interop) 03/31/20 1549)  naloxone (NARCAN) injection 1 mg (has no administration in time range)  heparin injection 5,000 Units (has no administration in time range)  sodium chloride flush (NS) 0.9 % injection 3 mL (has no  administration in time range)  0.9 %  sodium chloride infusion (has no administration in time range)  docusate sodium (COLACE) capsule 100 mg (has no administration in time range)  senna-docusate (Senokot-S) tablet 1 tablet (has no administration in time range)  sorbitol 70 % solution 30 mL (has no administration in time range)  ondansetron (ZOFRAN) tablet 4 mg (has no administration in time range)    Or  ondansetron (ZOFRAN) injection 4 mg (has no administration in time range)  albuterol (PROVENTIL) (2.5 MG/3ML) 0.083% nebulizer solution 2.5 mg (has no administration in time range)  acetaminophen (TYLENOL) tablet 650 mg (has no administration in time range)    Or  acetaminophen (TYLENOL) suppository 650 mg (has no administration in time range)  oxyCODONE (Oxy IR/ROXICODONE) immediate release tablet 5 mg (has no administration in time range)  magnesium citrate solution 1 Bottle (has no administration in time range)  hydrALAZINE (APRESOLINE) injection 10 mg (has no administration in time range)  ARIPiprazole (ABILIFY) tablet 5 mg (has no administration in time range)  FLUoxetine (PROZAC) capsule 40 mg (has no administration in time range)  traZODone (DESYREL) tablet 100 mg (has no administration in time range)  clonazePAM (KLONOPIN) tablet 1 mg (has no administration in time range)  lamoTRIgine (LAMICTAL) tablet 100 mg (has no administration in time range)  B-complex with vitamin C tablet 1 tablet (has no administration in time range)  multivitamin with minerals tablet 1 tablet (has no administration in time range)  piperacillin-tazobactam (ZOSYN) IVPB 3.375 g (has no administration in time range)  naloxone (NARCAN) injection 0.1 mg (has no administration in time range)  naloxone [facility]) injection 0.2 mg (0.2 mg Intravenous Given 03/31/20 1345)    Mobility walks

## 2020-03-31 NOTE — ED Triage Notes (Addendum)
Pt BIB EMS from home. Pt endorses using heroin and cocaine last night. EMS reports SpO2 was 75% on arrival. Pt was lethargic and weak on arrival. Pt not normally on oxygen.   BP 102/60 RR 16 HR 98 SpO2 100% 4L CBG 118  18G LAC

## 2020-03-31 NOTE — Progress Notes (Signed)
Pharmacy Antibiotic Note  Luke Harper is a 54 y.o. male admitted on 03/31/2020 with aspiration PNA.  Pharmacy has been consulted for Zosyn dosing x 3 days   Afebrile, WBC 16.6, SCr 1.91, Cl ~ 45 ml/min (normalized)  Plan: Zosyn 3.375g IV q8h (4 hour infusion). x 3 days per MD    Temp (24hrs), Avg:98.1 F (36.7 C), Min:98.1 F (36.7 C), Max:98.1 F (36.7 C)  Recent Labs  Lab 03/31/20 1332  WBC 16.6*  CREATININE 1.91*    CrCl cannot be calculated (Unknown ideal weight.).    No Known Allergies  Antimicrobials this admission: 7/23 Zosyn >>   Dose adjustments this admission:  Microbiology results: 7/23 HIV Ab: in process BloodCx: ordered UCx: ordered  Thank you for allowing pharmacy to be a part of this patient's care.  Otho Bellows PharmD 03/31/2020 4:35 PM

## 2020-03-31 NOTE — ED Notes (Signed)
Transport called to take patient upstairs 

## 2020-03-31 NOTE — ED Provider Notes (Signed)
[facility]-EMERGENCY DEPT Provider Note   CSN: [id number] Arrival date & time: 03/31/20  1257     History Chief Complaint  Patient presents with  . Low O2 Sats  . Weakness    Luke Harper is a 54 y.o. male.  HPI   Pt presents to the ED with complaints of overdosing.  Patient admits to using heroin and cocaine last night.  Patient is very lethargic and it is hard to get significant history from him.  When I speak loudly he will answer with a few words but then falls back asleep.  Patient denies having trouble with any chest pain.  She denies any fevers or chills.  Denies any Covid exposure.  States he has been vaccinated.  EMS was called to the home.  Patient was noted to be lethargic and weak and had an oxygen saturation of 75% on arrival.  Past Medical History:  Diagnosis Date  . Anxiety   . Bipolar disease, chronic (HCC)   . Depression   . Hepatitis C   . Pancreatitis     There are no problems to display for this patient.   History reviewed. No pertinent surgical history.     Family History  Problem Relation Age of Onset  . Cancer Mother   . Diabetes Mother   . Hypertension Mother   . Cancer Father     Social History   Tobacco Use  . Smoking status: Current Every Day Smoker    Packs/day: 2.00    Years: 20.00    Pack years: 40.00    Types: Cigarettes  . Smokeless tobacco: Never Used  Substance Use Topics  . Alcohol use: Yes    Comment: occasionally  . Drug use: Yes    Comment: heroin    Home Medications Prior to Admission medications   Medication Sig Start Date End Date Taking? Authorizing Provider  amphetamine-dextroamphetamine (ADDERALL) 30 MG tablet Take 1 tablet by mouth 2 (two) times daily. 03/27/20  Yes [provider]  ARIPiprazole (ABILIFY) 5 MG tablet Take 5 mg by mouth daily.  03/27/20  Yes [provider]  B Complex-C (B-COMPLEX WITH VITAMIN C) tablet Take 1 tablet by mouth daily.   Yes [provider]  amoxicillin-clavulanate (AUGMENTIN) 875-125 MG per tablet Take 1 tablet by mouth 2 (two) times daily. Patient not taking: Reported on 10/11/2018 01/16/14   Nelva Nay, PA-C  amphetamine-dextroamphetamine (ADDERALL) 20 MG tablet Take 20 mg by mouth 2 (two) times daily as needed (adhd).  Patient not taking: Reported on 03/31/2020 10/01/18   [provider]  cefTRIAXone (ROCEPHIN) 1 G injection Inject 1 g into the muscle once. Patient not taking: Reported on 10/11/2018 01/17/14   Shade Flood, MD  clonazePAM (KLONOPIN) 1 MG tablet Take 1 mg by mouth 3 (three) times daily as needed for anxiety.  10/15/13   [provider]  doxycycline (VIBRAMYCIN) 100 MG capsule Take 1 capsule (100 mg total) by mouth 2 (two) times daily. Patient not taking: Reported on 10/11/2018 01/16/14   Nelva Nay, PA-C  FLUoxetine (PROZAC) 40 MG capsule Take 40 mg by mouth daily. 02/15/20   [provider]  ibuprofen (ADVIL,MOTRIN) 200 MG tablet Take 200 mg by mouth every 6 (six) hours as needed for moderate pain.    [provider]  lamoTRIgine (LAMICTAL) 100 MG tablet Take 100 mg by mouth daily. 02/15/20   [provider]  Multiple Vitamin (MULTIVITAMIN WITH MINERALS) TABS Take  1 tablet by mouth daily.    [provider]  naproxen (NAPROSYN) 250 MG tablet Take 250 mg by mouth 2 (two) times daily as needed for moderate pain.    [provider]  QUEtiapine (SEROQUEL) 100 MG tablet Take 100 mg by mouth at bedtime.  01/07/20   [provider]  QUEtiapine (SEROQUEL) 400 MG tablet Take 400 mg by mouth at bedtime. Patient not taking: Reported on 03/31/2020 09/30/18   [provider]  traZODone (DESYREL) 100 MG tablet Take 100 mg by mouth at bedtime. 03/27/20   [provider]    Allergies    Patient has no known allergies.  Review of Systems   Review of Systems  All other systems reviewed and are negative.   Physical  Exam Updated Vital Signs BP (!) 114/90   Pulse 88   Temp 98.1 F (36.7 C) (Oral)   Resp (!) 24   SpO2 96%   Physical Exam Vitals and nursing note reviewed.  Constitutional:      Appearance: He is well-developed. He is ill-appearing.  HENT:     Head: Normocephalic and atraumatic.     Right Ear: External ear normal.     Left Ear: External ear normal.  Eyes:     General: No scleral icterus.       Right eye: No discharge.        Left eye: No discharge.     Conjunctiva/sclera: Conjunctivae normal.  Neck:     Trachea: No tracheal deviation.  Cardiovascular:     Rate and Rhythm: Normal rate and regular rhythm.  Pulmonary:     Effort: Pulmonary effort is normal. No respiratory distress.     Breath sounds: Normal breath sounds. No stridor. No wheezing or rales.  Abdominal:     General: Bowel sounds are normal. There is no distension.     Palpations: Abdomen is soft.     Tenderness: There is no abdominal tenderness. There is no guarding or rebound.  Musculoskeletal:        General: No tenderness.     Cervical back: Neck supple.  Skin:    General: Skin is warm and dry.     Findings: No rash.  Neurological:     Mental Status: He is lethargic.     GCS: GCS eye subscore is 3. GCS verbal subscore is 4. GCS motor subscore is 6.     Cranial Nerves: No cranial nerve deficit (no facial droop, extraocular movements intact, no slurred speech).     Sensory: No sensory deficit.     Motor: No abnormal muscle tone or seizure activity.     Coordination: Coordination normal.     Comments: Wakes up when spoken to loudly, follows commands, immediately falls back asleep     ED Results / Procedures / Treatments   Labs (all labs ordered are listed, but only abnormal results are displayed) Labs Reviewed  CBC WITH DIFFERENTIAL/PLATELET - Abnormal; Notable for the following components:      Result Value   WBC 16.6 (*)    Hemoglobin 17.6 (*)    HCT 52.5 (*)    Neutro Abs 14.0 (*)    Monocytes  Absolute 1.4 (*)    All other components within normal limits  BASIC METABOLIC PANEL - Abnormal; Notable for the following components:   Potassium 5.7 (*)    Glucose, Bld 106 (*)    BUN 34 (*)    Creatinine, Ser 1.91 (*)    Calcium  7.9 (*)    GFR calc non Af Amer 39 (*)    GFR calc Af Amer 45 (*)    All other components within normal limits  BRAIN NATRIURETIC PEPTIDE - Abnormal; Notable for the following components:   B Natriuretic Peptide 101.6 (*)    All other components within normal limits  CBG MONITORING, ED - Abnormal; Notable for the following components:   Glucose-Capillary 120 (*)    All other components within normal limits  SARS CORONAVIRUS 2 BY RT PCR (HOSPITAL ORDER, PERFORMED IN [facility])  ETHANOL  RAPID URINE DRUG SCREEN, HOSP PERFORMED    EKG EKG Interpretation  Date/Time:  Friday March 31 2020 13:11:06 EDT Ventricular Rate:  98 PR Interval:    QRS Duration: 89 QT Interval:  351 QTC Calculation: 449 R Axis:   107 Text Interpretation: Sinus rhythm LAE, consider biatrial enlargement Right axis deviation No significant change since last tracing Confirmed by Linwood Dibbles [phone]) on 03/31/2020 1:31:01 PM   Radiology DG Chest Portable 1 View  Result Date: 03/31/2020 CLINICAL DATA:  Dyspnea.  Drug abuse. EXAM: PORTABLE CHEST 1 VIEW COMPARISON:  08/01/2018 FINDINGS: Poor inspiration. Grossly stable normal sized heart and clear lungs. Mild lower thoracic spine degenerative changes. IMPRESSION: No acute abnormality. Electronically Signed   By: Beckie Salts M.D.   On: 03/31/2020 14:13    Procedures Procedures (including critical care time)  Medications Ordered in ED Medications  sodium chloride 0.9 % bolus 1,000 mL (1,000 mLs Intravenous New Bag/Given (Non-Interop) 03/31/20 1549)    Followed by  0.9 %  sodium chloride infusion (1,000 mLs Intravenous New Bag/Given (Non-Interop) 03/31/20 1549)  naloxone (NARCAN) injection 1 mg (has no administration in  time range)  naloxone [facility]) injection 0.2 mg (0.2 mg Intravenous Given 03/31/20 1345)    ED Course  I have reviewed the triage vital signs and the nursing notes.  Pertinent labs & imaging results that were available during my care of the patient were reviewed by me and considered in my medical decision making (see chart for details).  Clinical Course as of Apr 01 1457  Fri Mar 31, 2020  1424 Labs reviewed.  CBC shows an elevated white blood cell count   [JK]  1424 Metabolic panel shows elevated BUN and creatinine   [JK]  1514 Chest x-ray without acute findings   [JK]  1547 Patient does wake up when spoken to but still no quickly falls back asleep.   [JK]  1558 D/w Dr Dairl Ponder.  Will add on procalcitonin and lactic acid level.   [JK]    Clinical Course User Index [JK] Linwood Dibbles, MD   MDM Rules/Calculators/A&P                          Patient presented to the ED for evaluation of an accidental drug overdose.  Patient has been given Narcan by EMS as well as in the ED.  He remains listless and lethargic although he will wake up and speak when spoken to.  However, within a minute the patient starts nodding off again.  Patient does have leukocytosis but no evidence of pneumonia on x-ray.  Labs do show evidence of an acute kidney injury.  IV fluids have been ordered.  Patient is still requiring oxygen.  He otherwise appears hemodynamically stable.  I think he warrants more prolonged monitoring and I have ordered an additional dose of Narcan.  I will consult the medical service  for admission. Final Clinical Impression(s) / ED Diagnoses Final diagnoses:  AKI (acute kidney injury) (HCC)  Accidental drug overdose, initial encounter      Linwood DibblesKnapp, Kahealani Yankovich, MD 04/01/20 1458
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
## 2020-04-01 DIAGNOSIS — F329 Major depressive disorder, single episode, unspecified: Secondary | ICD-10-CM

## 2020-04-01 DIAGNOSIS — T50901A Poisoning by unspecified drugs, medicaments and biological substances, accidental (unintentional), initial encounter: Secondary | ICD-10-CM

## 2020-04-01 DIAGNOSIS — F1910 Other psychoactive substance abuse, uncomplicated: Secondary | ICD-10-CM

## 2020-04-01 DIAGNOSIS — F419 Anxiety disorder, unspecified: Secondary | ICD-10-CM

## 2020-04-01 DIAGNOSIS — F319 Bipolar disorder, unspecified: Secondary | ICD-10-CM

## 2020-04-01 DIAGNOSIS — T50901D Poisoning by unspecified drugs, medicaments and biological substances, accidental (unintentional), subsequent encounter: Secondary | ICD-10-CM

## 2020-04-01 DIAGNOSIS — N179 Acute kidney failure, unspecified: Secondary | ICD-10-CM

## 2020-04-01 LAB — CBC
HCT: 55.4 % — ABNORMAL HIGH (ref 39.0–52.0)
Hemoglobin: 18.5 g/dL — ABNORMAL HIGH (ref 13.0–17.0)
MCH: 31.3 pg (ref 26.0–34.0)
MCHC: 33.4 g/dL (ref 30.0–36.0)
MCV: 93.6 fL (ref 80.0–100.0)
Platelets: 229 10*3/uL (ref 150–400)
RBC: 5.92 MIL/uL — ABNORMAL HIGH (ref 4.22–5.81)
RDW: 13.2 % (ref 11.5–15.5)
WBC: 11.9 10*3/uL — ABNORMAL HIGH (ref 4.0–10.5)
nRBC: 0 % (ref 0.0–0.2)

## 2020-04-01 LAB — BASIC METABOLIC PANEL
Anion gap: 14 (ref 5–15)
BUN: 41 mg/dL — ABNORMAL HIGH (ref 6–20)
CO2: 27 mmol/L (ref 22–32)
Calcium: 8.4 mg/dL — ABNORMAL LOW (ref 8.9–10.3)
Chloride: 99 mmol/L (ref 98–111)
Creatinine, Ser: 2.01 mg/dL — ABNORMAL HIGH (ref 0.61–1.24)
GFR calc Af Amer: 42 mL/min — ABNORMAL LOW (ref >60)
GFR calc non Af Amer: 36 mL/min — ABNORMAL LOW (ref >60)
Glucose, Bld: 101 mg/dL — ABNORMAL HIGH (ref 70–99)
Potassium: 4.5 mmol/L (ref 3.5–5.1)
Sodium: 140 mmol/L (ref 135–145)

## 2020-04-01 LAB — PROTIME-INR
INR: 0.9 (ref 0.8–1.2)
Prothrombin Time: 11.9 seconds (ref 11.4–15.2)

## 2020-04-01 LAB — APTT: aPTT: 32 seconds (ref 24–36)

## 2020-04-01 LAB — PROCALCITONIN: Procalcitonin: 16.93 ng/mL

## 2020-04-01 MED ORDER — NAPROXEN 500 MG PO TABS: 500.0000 mg | ORAL_TABLET | Freq: Two times a day (BID) | ORAL | Status: DC | PRN

## 2020-04-01 MED ORDER — LOPERAMIDE HCL 2 MG PO CAPS: 2.0000 mg | ORAL_CAPSULE | ORAL | Status: DC | PRN

## 2020-04-01 MED ORDER — DICYCLOMINE HCL 20 MG PO TABS: 20.0000 mg | ORAL_TABLET | Freq: Four times a day (QID) | ORAL | Status: DC | PRN

## 2020-04-01 MED ORDER — METHOCARBAMOL 500 MG PO TABS: 500.0000 mg | ORAL_TABLET | Freq: Three times a day (TID) | ORAL | Status: DC | PRN

## 2020-04-01 MED ORDER — HYDROXYZINE HCL 25 MG PO TABS: 25.0000 mg | ORAL_TABLET | Freq: Four times a day (QID) | ORAL | Status: DC | PRN
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
## 2020-04-01 NOTE — Consult Note (Signed)
[facility] Face-to-Face Psychiatry Consult   Reason for Consult:  ''H/o of Bipolar, Depression/Anxiety-overdosed on Cocaine/Heroin needs eval. Medication change/modification.'' Referring Physician:  Nevin Bloodgood, MD Patient Identification: Luke Harper MRN:  [id number] Principal Diagnosis: Overdose, accidental or unintentional, initial encounter Diagnosis:  Principal Problem:   Overdose, accidental or unintentional, initial encounter Active Problems:   Overdose   AKI (acute kidney injury) (HCC)   Bipolar 1 disorder (HCC)   Substance abuse (HCC)   Anxiety   Depression   Chronic hepatitis C without hepatic coma (HCC)   Total Time spent with patient: 1 hour  Subjective:   Luke Harper is a 54 y.o. male patient admitted after overdosing on Heroin .  HPI: Patient who reports history of Bipolar depression, Anxiety, Chronic hep C and polysubstance dependence. He states that he accidentally overdosed on Heroin/Cocaine while using with his friends 2 days ago. Patient is alert, oriented but reports being fatigued. He reports a long history of polysubstance use but currently receiving treatment from Dr. Mila Homer at [facility] in [place] who prescribed Adderall, Clonazepam, mood stabilizer and Fluoxetine for him. Patient reports that he was stable on his medications prior to this event and would like to continue same medications and be referred back to Dr. Mila Homer upon discharge. Today, he denies anxiety, depression, psychosis, delusions, self harming thoughts and alcohol abuse. Past Psychiatric History:   Risk to Self:  denies Risk to Others:  denies Prior Inpatient Therapy:  Rehab in the past Prior Outpatient Therapy:  [facility] with Dr. Mila Homer  Past Medical History:  Past Medical History:  Diagnosis Date  . Anxiety   . Bipolar disease, chronic (HCC)   . Depression   . Hepatitis C   . Pancreatitis    History reviewed. No pertinent surgical history. Family History:  Family  History  Problem Relation Age of Onset  . Cancer Mother   . Diabetes Mother   . Hypertension Mother   . Cancer Father    Family Psychiatric  History:  Social History:  Social History   Substance and Sexual Activity  Alcohol Use Yes   Comment: occasionally     Social History   Substance and Sexual Activity  Drug Use Yes   Comment: heroin    Social History   Socioeconomic History  . Marital status: Single    Spouse name: Not on file  . Number of children: Not on file  . Years of education: Not on file  . Highest education level: Not on file  Occupational History  . Not on file  Tobacco Use  . Smoking status: Current Every Day Smoker    Packs/day: 2.00    Years: 20.00    Pack years: 40.00    Types: Cigarettes  . Smokeless tobacco: Never Used  Substance and Sexual Activity  . Alcohol use: Yes    Comment: occasionally  . Drug use: Yes    Comment: heroin  . Sexual activity: Not on file  Other Topics Concern  . Not on file  Social History Narrative  . Not on file   Social Determinants of Health   Financial Resource Strain:   . Difficulty of Paying Living Expenses:   Food Insecurity:   . Worried About Programme researcher, broadcasting/film/video in the Last Year:   . Barista in the Last Year:   Transportation Needs:   . Freight forwarder (Medical):   Marland Kitchen Lack of Transportation (Non-Medical):   Physical Activity:   .  Days of Exercise per Week:   . Minutes of Exercise per Session:   Stress:   . Feeling of Stress :   Social Connections:   . Frequency of Communication with Friends and Family:   . Frequency of Social Gatherings with Friends and Family:   . Attends Religious Services:   . Active Member of Clubs or Organizations:   . Attends Banker Meetings:   Marland Kitchen Marital Status:    Additional Social History:    Allergies:  No Known Allergies  Labs:  Results for orders placed or performed during the hospital encounter of 03/31/20 (from the past 48 hour(s))   CBG monitoring, ED     Status: Abnormal   Collection Time: 03/31/20  1:14 PM  Result Value Ref Range   Glucose-Capillary 120 (H) 70 - 99 mg/dL    Comment: Glucose reference range applies only to samples taken after fasting for at least 8 hours.  CBC with Differential/Platelet     Status: Abnormal   Collection Time: 03/31/20  1:32 PM  Result Value Ref Range   WBC 16.6 (H) 4.0 - 10.5 K/uL   RBC 5.53 4.22 - 5.81 MIL/uL   Hemoglobin 17.6 (H) 13.0 - 17.0 g/dL   HCT 75.6 (H) 39 - 52 %   MCV 94.9 80.0 - 100.0 fL   MCH 31.8 26.0 - 34.0 pg   MCHC 33.5 30.0 - 36.0 g/dL   RDW 43.3 29.5 - 18.8 %   Platelets 269 150 - 400 K/uL   nRBC 0.0 0.0 - 0.2 %   Neutrophils Relative % 85 %   Neutro Abs 14.0 (H) 1.7 - 7.7 K/uL   Lymphocytes Relative 7 %   Lymphs Abs 1.2 0.7 - 4.0 K/uL   Monocytes Relative 8 %   Monocytes Absolute 1.4 (H) 0 - 1 K/uL   Eosinophils Relative 0 %   Eosinophils Absolute 0.0 0 - 0 K/uL   Basophils Relative 0 %   Basophils Absolute 0.0 0 - 0 K/uL   Immature Granulocytes 0 %   Abs Immature Granulocytes 0.05 0.00 - 0.07 K/uL    Comment: Performed at [facility], [address] [place], [place] [address]  Basic metabolic panel     Status: Abnormal   Collection Time: 03/31/20  1:32 PM  Result Value Ref Range   Sodium 140 135 - 145 mmol/L   Potassium 5.7 (H) 3.5 - 5.1 mmol/L   Chloride 103 98 - 111 mmol/L   CO2 24 22 - 32 mmol/L   Glucose, Bld 106 (H) 70 - 99 mg/dL    Comment: Glucose reference range applies only to samples taken after fasting for at least 8 hours.   BUN 34 (H) 6 - 20 mg/dL   Creatinine, Ser 6.30 (H) 0.61 - 1.24 mg/dL   Calcium 7.9 (L) 8.9 - 10.3 mg/dL   GFR calc non Af Amer 39 (L) >60 mL/min   GFR calc Af Amer 45 (L) >60 mL/min   Anion gap 13 5 - 15    Comment: Performed at [facility], [address] [place], [place] [address]  Brain natriuretic peptide     Status: Abnormal   Collection Time: 03/31/20  1:32 PM   Result Value Ref Range   B Natriuretic Peptide 101.6 (H) 0.0 - 100.0 pg/mL    Comment: Performed at [facility], [address], [place], [place] [address]  Ethanol     Status: None  Collection Time: 03/31/20  1:32 PM  Result Value Ref Range   Alcohol, Ethyl (B) <10 <10 mg/dL    Comment: (NOTE) Lowest detectable limit for serum alcohol is 10 mg/dL.  For medical purposes only. Performed at [facility], [address] [place], [place] [address]   SARS Coronavirus 2 by RT PCR (hospital order, performed in [facility] hospital lab) Nasopharyngeal Nasopharyngeal Swab     Status: None   Collection Time: 03/31/20  1:38 PM   Specimen: Nasopharyngeal Swab  Result Value Ref Range   SARS Coronavirus 2 NEGATIVE NEGATIVE    Comment: (NOTE) SARS-CoV-2 target nucleic acids are NOT DETECTED.  The SARS-CoV-2 RNA is generally detectable in upper and lower respiratory specimens during the acute phase of infection. The lowest concentration of SARS-CoV-2 viral copies this assay can detect is 250 copies / mL. A negative result does not preclude SARS-CoV-2 infection and should not be used as the sole basis for treatment or other patient management decisions.  A negative result may occur with improper specimen collection / handling, submission of specimen other than nasopharyngeal swab, presence of viral mutation(s) within the areas targeted by this assay, and inadequate number of viral copies (<250 copies / mL). A negative result must be combined with clinical observations, patient history, and epidemiological information.  Fact Sheet for Patients:   BoilerBrush.com.cy  Fact Sheet for Healthcare Providers: https://pope.com/  This test is not yet approved or  cleared by the [place] FDA and has been authorized for detection and/or diagnosis of SARS-CoV-2 by FDA under an Emergency Use Authorization (EUA).   This EUA will remain in effect (meaning this test can be used) for the duration of the COVID-19 declaration under Section 564(b)(1) of the Act, 21 U.S.C. section 360bbb-3(b)(1), unless the authorization is terminated or revoked sooner.  Performed at [facility], [address] [place], [place] [address]   Rapid urine drug screen (hospital performed)     Status: Abnormal   Collection Time: 03/31/20  3:25 PM  Result Value Ref Range   Opiates POSITIVE (A) NONE DETECTED   Cocaine POSITIVE (A) NONE DETECTED   Benzodiazepines NONE DETECTED NONE DETECTED   Amphetamines POSITIVE (A) NONE DETECTED   Tetrahydrocannabinol NONE DETECTED NONE DETECTED   Barbiturates NONE DETECTED NONE DETECTED    Comment: (NOTE) DRUG SCREEN FOR MEDICAL PURPOSES ONLY.  IF CONFIRMATION IS NEEDED FOR ANY PURPOSE, NOTIFY LAB WITHIN 5 DAYS.  LOWEST DETECTABLE LIMITS FOR URINE DRUG SCREEN Drug Class                     Cutoff (ng/mL) Amphetamine and metabolites    1000 Barbiturate and metabolites    200 Benzodiazepine                 200 Tricyclics and metabolites     300 Opiates and metabolites        300 Cocaine and metabolites        300 THC                            50 Performed at [facility], [address], [place], [place] [address]   Lactic acid, plasma     Status: None   Collection Time: 03/31/20  3:59 PM  Result Value Ref Range   Lactic Acid, Venous 1.3 0.5 - 1.9 mmol/L    Comment: Performed at [facility],  [address] [place], [place] [address]  HIV Antibody (routine testing w rflx)     Status: None   Collection Time: 03/31/20  3:59 PM  Result Value Ref Range   HIV Screen 4th Generation wRfx Non Reactive Non Reactive    Comment: Performed at [facility], [address] [place], [place] [address]  Brain natriuretic peptide     Status: Abnormal   Collection Time: 03/31/20  4:01 PM  Result Value Ref Range   B  Natriuretic Peptide 107.2 (H) 0.0 - 100.0 pg/mL    Comment: Performed at [facility], [address] [place], [place] [address]  TSH     Status: None   Collection Time: 03/31/20  4:01 PM  Result Value Ref Range   TSH 1.595 0.350 - 4.500 uIU/mL    Comment: Performed by a 3rd Generation assay with a functional sensitivity of <=0.01 uIU/mL. Performed at [facility], [address] [place], [place] [address]   Urinalysis, Routine w reflex microscopic     Status: Abnormal   Collection Time: 03/31/20  4:31 PM  Result Value Ref Range   Color, Urine AMBER (A) YELLOW    Comment: BIOCHEMICALS MAY BE AFFECTED BY COLOR   APPearance CLOUDY (A) CLEAR   Specific Gravity, Urine 1.021 1.005 - 1.030   pH 5.0 5.0 - 8.0   Glucose, UA 50 (A) NEGATIVE mg/dL   Hgb urine dipstick LARGE (A) NEGATIVE   Bilirubin Urine NEGATIVE NEGATIVE   Ketones, ur NEGATIVE NEGATIVE mg/dL   Protein, ur 295100 (A) NEGATIVE mg/dL   Nitrite NEGATIVE NEGATIVE   Leukocytes,Ua NEGATIVE NEGATIVE   RBC / HPF 11-20 0 - 5 RBC/hpf   WBC, UA 0-5 0 - 5 WBC/hpf   Bacteria, UA RARE (A) NONE SEEN   Squamous Epithelial / LPF 0-5 0 - 5   Mucus PRESENT    Ca Oxalate Crys, UA PRESENT    Sperm, UA PRESENT     Comment: Performed at [facility], [address] [place], [place] [address]  Calcium     Status: Abnormal   Collection Time: 03/31/20  4:34 PM  Result Value Ref Range   Calcium 8.3 (L) 8.9 - 10.3 mg/dL    Comment: Performed at [facility], [address] [place], [place] [address]  Magnesium     Status: Abnormal   Collection Time: 03/31/20  4:34 PM  Result Value Ref Range   Magnesium 2.7 (H) 1.7 - 2.4 mg/dL    Comment: Performed at [facility], [address] [place], [place] [address]  Procalcitonin     Status: None   Collection Time: 03/31/20  4:34 PM  Result Value Ref Range   Procalcitonin 17.80 ng/mL    Comment:         Interpretation: PCT >= 10 ng/mL: Important systemic inflammatory response, almost exclusively due to severe bacterial sepsis or septic shock. (NOTE)       Sepsis PCT Algorithm           Lower Respiratory Tract                                      Infection PCT Algorithm    ----------------------------     ----------------------------         PCT < 0.25 ng/mL  PCT < 0.10 ng/mL          Strongly encourage             Strongly discourage   discontinuation of antibiotics    initiation of antibiotics    ----------------------------     -----------------------------       PCT 0.25 - 0.50 ng/mL            PCT 0.10 - 0.25 ng/mL               OR       >80% decrease in PCT            Discourage initiation of                                            antibiotics      Encourage discontinuation           of antibiotics    ----------------------------     -----------------------------         PCT >= 0.50 ng/mL              PCT 0.26 - 0.50 ng/mL                AND       <80% decrease in PCT             Encourage initiation of                                             antibiotics       Encourage continuation           of antibiotics    ----------------------------     -----------------------------        PCT >= 0.50 ng/mL                  PCT > 0.50 ng/mL               AND         increase in PCT                  Strongly encourage                                      initiation of antibiotics    Strongly encourage escalation           of antibiotics                                     -----------------------------                                           PCT <= 0.25 ng/mL                                                 OR                                        >  80% decrease in PCT                                      Discontinue / Do not initiate                                             antibiotics  Performed at [facility], [address], [place], [place] [address]   Phosphorus     Status: Abnormal   Collection Time: 03/31/20  4:34 PM  Result Value Ref Range   Phosphorus 7.1 (H) 2.5 - 4.6 mg/dL    Comment: Performed at [facility], [address] [place], [place] [address]  Hemoglobin A1c     Status: Abnormal   Collection Time: 03/31/20  6:45 PM  Result Value Ref Range   Hgb A1c MFr Bld 5.8 (H) 4.8 - 5.6 %    Comment: (NOTE) Pre diabetes:          5.7%-6.4%  Diabetes:              >6.4%  Glycemic control for   <7.0% adults with diabetes    Mean Plasma Glucose 119.76 mg/dL    Comment: Performed at [facility], [address], [place], [place] [address]  Culture, blood (routine x 2)     Status: None (Preliminary result)   Collection Time: 03/31/20  6:45 PM   Specimen: BLOOD  Result Value Ref Range   Specimen Description      BLOOD RIGHT ANTECUBITAL Performed at [facility], [address] [place], [place] [address]    Special Requests      BOTTLES DRAWN AEROBIC ONLY Blood Culture adequate volume Performed at [facility], [address] [place], [place] [address]    Culture      NO GROWTH < 12 HOURS Performed at [facility], [address], [place], [place] [address]    Report Status PENDING   Culture, blood (routine x 2)     Status: None (Preliminary result)   Collection Time: 03/31/20  6:45 PM   Specimen: BLOOD RIGHT HAND  Result Value Ref Range   Specimen Description      BLOOD RIGHT HAND Performed at [facility], [address], [place], [place] [address]    Special Requests      BOTTLES DRAWN AEROBIC AND ANAEROBIC Blood Culture adequate volume Performed at [facility], [address], [place], [place] [address]    Culture      NO GROWTH < 12 HOURS Performed at [facility] [place] [facility], [address] [place], [place] [address]    Report Status PENDING   Procalcitonin     Status: None    Collection Time: 04/01/20  6:17 AM  Result Value Ref Range   Procalcitonin 16.93 ng/mL    Comment:        Interpretation: PCT >= 10 ng/mL: Important systemic inflammatory response, almost exclusively due to severe bacterial sepsis or septic shock. (NOTE)       Sepsis PCT Algorithm           Lower Respiratory Tract  Infection PCT Algorithm    ----------------------------     ----------------------------         PCT < 0.25 ng/mL                PCT < 0.10 ng/mL          Strongly encourage             Strongly discourage   discontinuation of antibiotics    initiation of antibiotics    ----------------------------     -----------------------------       PCT 0.25 - 0.50 ng/mL            PCT 0.10 - 0.25 ng/mL               OR       >80% decrease in PCT            Discourage initiation of                                            antibiotics      Encourage discontinuation           of antibiotics    ----------------------------     -----------------------------         PCT >= 0.50 ng/mL              PCT 0.26 - 0.50 ng/mL                AND       <80% decrease in PCT             Encourage initiation of                                             antibiotics       Encourage continuation           of antibiotics    ----------------------------     -----------------------------        PCT >= 0.50 ng/mL                  PCT > 0.50 ng/mL               AND         increase in PCT                  Strongly encourage                                      initiation of antibiotics    Strongly encourage escalation           of antibiotics                                     -----------------------------                                           PCT <= 0.25 ng/mL  OR                                        > 80% decrease in PCT                                      Discontinue / Do not initiate                                              antibiotics  Performed at [facility], [address] [place], [place] [address]   Basic metabolic panel     Status: Abnormal   Collection Time: 04/01/20  6:17 AM  Result Value Ref Range   Sodium 140 135 - 145 mmol/L   Potassium 4.5 3.5 - 5.1 mmol/L    Comment: DELTA CHECK NOTED   Chloride 99 98 - 111 mmol/L   CO2 27 22 - 32 mmol/L   Glucose, Bld 101 (H) 70 - 99 mg/dL    Comment: Glucose reference range applies only to samples taken after fasting for at least 8 hours.   BUN 41 (H) 6 - 20 mg/dL   Creatinine, Ser 5.88 (H) 0.61 - 1.24 mg/dL   Calcium 8.4 (L) 8.9 - 10.3 mg/dL   GFR calc non Af Amer 36 (L) >60 mL/min   GFR calc Af Amer 42 (L) >60 mL/min   Anion gap 14 5 - 15    Comment: Performed at [facility], [address] [place], [place] [address]  CBC     Status: Abnormal   Collection Time: 04/01/20  6:17 AM  Result Value Ref Range   WBC 11.9 (H) 4.0 - 10.5 K/uL   RBC 5.92 (H) 4.22 - 5.81 MIL/uL   Hemoglobin 18.5 (H) 13.0 - 17.0 g/dL   HCT 41.2 (H) 39 - 52 %   MCV 93.6 80.0 - 100.0 fL   MCH 31.3 26.0 - 34.0 pg   MCHC 33.4 30.0 - 36.0 g/dL   RDW 87.8 67.6 - 72.0 %   Platelets 229 150 - 400 K/uL   nRBC 0.0 0.0 - 0.2 %    Comment: Performed at [facility], [address] [place], [place] [address]  Protime-INR     Status: None   Collection Time: 04/01/20  6:17 AM  Result Value Ref Range   Prothrombin Time 11.9 11.4 - 15.2 seconds   INR 0.9 0.8 - 1.2    Comment: (NOTE) INR goal varies based on device and disease states. Performed at [facility], [address] [place], [place] [address]   APTT     Status: None   Collection Time: 04/01/20  6:17 AM  Result Value Ref Range   aPTT 32 24 - 36 seconds    Comment: Performed at [facility], [address] [place], [place] [address]    Current Facility-Administered Medications  Medication Dose  Route Frequency Provider Last Rate Last Admin  . 0.9 %  sodium chloride infusion  1,000 mL Intravenous Continuous Linwood Dibbles, MD 125 mL/hr at 03/31/20 1549 1,000 mL at 03/31/20 1549  . 0.9 %  sodium chloride infusion   Intravenous  Continuous Kendell Bane, MD 125 mL/hr at 03/31/20 2001 New Bag at 03/31/20 2001  . acetaminophen (TYLENOL) tablet 650 mg  650 mg Oral Q6H PRN Shahmehdi, Seyed A, MD       Or  . acetaminophen (TYLENOL) suppository 650 mg  650 mg Rectal Q6H PRN Shahmehdi, Seyed A, MD      . albuterol (PROVENTIL) (2.5 MG/3ML) 0.083% nebulizer solution 2.5 mg  2.5 mg Nebulization Q2H PRN Shahmehdi, Seyed A, MD      . ARIPiprazole (ABILIFY) tablet 5 mg  5 mg Oral Daily Shahmehdi, Seyed A, MD   5 mg at 04/01/20 0949  . B-complex with vitamin C tablet 1 tablet  1 tablet Oral Daily Shahmehdi, Seyed A, MD   1 tablet at 04/01/20 0949  . clonazePAM (KLONOPIN) tablet 1 mg  1 mg Oral TID PRN Shahmehdi, Seyed A, MD      . docusate sodium (COLACE) capsule 100 mg  100 mg Oral BID Nevin Bloodgood A, MD   100 mg at 04/01/20 0949  . FLUoxetine (PROZAC) capsule 40 mg  40 mg Oral Daily Shahmehdi, Seyed A, MD   40 mg at 04/01/20 0949  . heparin injection 5,000 Units  5,000 Units Subcutaneous Q8H Nevin Bloodgood A, MD   5,000 Units at 04/01/20 0602  . hydrALAZINE (APRESOLINE) injection 10 mg  10 mg Intravenous Q4H PRN Shahmehdi, Seyed A, MD      . lamoTRIgine (LAMICTAL) tablet 100 mg  100 mg Oral Daily Shahmehdi, Seyed A, MD   100 mg at 04/01/20 0949  . magnesium citrate solution 1 Bottle  1 Bottle Oral Once PRN Shahmehdi, Seyed A, MD      . multivitamin with minerals tablet 1 tablet  1 tablet Oral Daily Shahmehdi, Seyed A, MD   1 tablet at 04/01/20 0949  . naloxone [facility]) injection 0.1 mg  0.1 mg Intravenous Once Shahmehdi, Seyed A, MD      . naloxone (NARCAN) injection 1 mg  1 mg Intravenous Once Linwood Dibbles, MD      . ondansetron [facility]) tablet 4 mg  4 mg Oral Q6H PRN Shahmehdi, Seyed A, MD        Or  . ondansetron (ZOFRAN) injection 4 mg  4 mg Intravenous Q6H PRN Shahmehdi, Seyed A, MD      . oxyCODONE (Oxy IR/ROXICODONE) immediate release tablet 5 mg  5 mg Oral Q4H PRN Shahmehdi, Seyed A, MD      . piperacillin-tazobactam (ZOSYN) IVPB 3.375 g  3.375 g Intravenous Q8H Green, Terri L, RPH 12.5 mL/hr at 04/01/20 0950 3.375 g at 04/01/20 0950  . senna-docusate (Senokot-S) tablet 1 tablet  1 tablet Oral QHS PRN Shahmehdi, Seyed A, MD      . sodium chloride flush (NS) 0.9 % injection 3 mL  3 mL Intravenous Q12H Shahmehdi, Seyed A, MD   3 mL at 03/31/20 2127  . sorbitol 70 % solution 30 mL  30 mL Oral Daily PRN Shahmehdi, Seyed A, MD      . traZODone (DESYREL) tablet 100 mg  100 mg Oral QHS Shahmehdi, Seyed A, MD   100 mg at 03/31/20 2126    Musculoskeletal: Strength & Muscle Tone: not tested Gait & Station: unsteady Patient leans: N/A  Psychiatric Specialty Exam: Physical Exam Psychiatric:        Attention and Perception: He is inattentive.        Mood and Affect: Affect is blunt.        Speech: Speech is  delayed and slurred.        Behavior: Behavior is cooperative.        Thought Content: Thought content normal.        Cognition and Memory: Cognition normal.        Judgment: Judgment is impulsive.     Review of Systems  Constitutional: Positive for fatigue.  HENT: Negative.   Eyes: Negative.   Psychiatric/Behavioral: Positive for dysphoric mood. The patient is nervous/anxious.     Blood pressure 120/85, pulse 86, temperature 98.5 F (36.9 C), resp. rate 15, height  (1.778 m), weight 85.5 kg, SpO2 100 %.Body mass index is 27.03 kg/m.  General Appearance: Casual  Eye Contact:  Fair  Speech:  Clear and Coherent and Slow  Volume:  Decreased  Mood:  Dysphoric  Affect:  Congruent  Thought Process:  Coherent  Orientation:  Full (Time, Place, and Person)  Thought Content:  Logical  Suicidal Thoughts:  No  Homicidal Thoughts:  No  Memory:  Immediate;   Fair Recent;    Fair Remote;   Fair  Judgement:  Fair  Insight:  Shallow  Psychomotor Activity:  Psychomotor Retardation  Concentration:  Concentration: Fair and Attention Span: Fair  Recall:  [employer] of Knowledge:  Fair  Language:  Good  Akathisia:  No  Handed:  Right  AIMS (if indicated):     Assets:  Communication Skills Social Support  ADL's:  Intact  Cognition:  WNL  Sleep:        Treatment Plan Summary:  54 year old male with history of Bipolar depression, Anxiety, Chronic hep C, polysubstance dependence who was admitted to the hospital after he overdosed on Heroin/Cocaine while using with his friend. Today, he is alert, cooperative, denies self harming thoughts, psychosis and delusions. He want to continue on his current medications as prescribed by his doctor at [facility] in [place], [place].  Recommendations: -Continue Abilify, Lamotrigine, Trazodone and Fluoxetine  -Consider weaning patient off Opioid and Benzodiazepine due to reccurent substance abuse.  -Consider social worker consult to facilitate referral to Dr. Mila Homer at [facility] after patient is medically stable. Disposition: No evidence of imminent risk to self or others at present.   Patient does not meet criteria for psychiatric inpatient admission. Supportive therapy provided about ongoing stressors. Psychiatric service signing out. Re-consult as needed  Thedore Mins, MD 04/01/2020 11:44 AM

## 2020-04-01 NOTE — Progress Notes (Signed)
PHARMACY NOTE -  Zosyn  Pharmacy has been assisting with dosing of Zosyn for post-overdose aspiration.  Dosage remains stable at 3.375 g IV q8 hr and need for further dosage adjustment appears unlikely at present given limited 3d course and adequate renal function  Pharmacy will sign off, following peripherally for culture results or dose adjustments. Please reconsult if a change in clinical status warrants re-evaluation of dosage.  Bernadene Person, PharmD, BCPS [phone] 04/01/2020, 2:17 PM

## 2020-04-01 NOTE — Evaluation (Signed)
Occupational Therapy Evaluation Patient Details Name: Luke Harper MRN: [id number] DOB: Nov 05, 1965 Today's Date: 04/01/2020    History of Present Illness Patient who reports history of Bipolar depression, Anxiety, Chronic hep C and polysubstance dependence. He states that he accidentally overdosed on Heroin/Cocaine while using with his friends 2 days ago.   Clinical Impression   Pt admitted with overdose. Pt currently with functional limitations due to the deficits listed below (see OT Problem List).  Pt will benefit from skilled OT to increase their safety and independence with ADL and functional mobility for ADL to facilitate discharge to venue listed below.   Pt presents at sleepy with OT. RN reports he has been sleepingmost of day. Pt did agree to get to chair and take a washup     Follow Up Recommendations  Supervision/Assistance - 24 hour    Equipment Recommendations  None recommended by OT    Recommendations for Other Services       Precautions / Restrictions Precautions Precautions: Fall      Mobility Bed Mobility Overal bed mobility: Needs Assistance Bed Mobility: Supine to Sit     Supine to sit: Min assist        Transfers Overall transfer level: Needs assistance Equipment used: 1 person hand held assist Transfers: Stand Pivot Transfers;Squat Pivot Transfers   Stand pivot transfers: Mod assist Squat pivot transfers: Mod assist          Balance Overall balance assessment: Needs assistance Sitting-balance support: Single extremity supported Sitting balance-Leahy Scale: Fair     Standing balance support: Single extremity supported Standing balance-Leahy Scale: Poor                             ADL either performed or assessed with clinical judgement   ADL Overall ADL's : Needs assistance/impaired Eating/Feeding: Minimal assistance;Sitting   Grooming: Minimal assistance;Sitting   Upper Body Bathing: Minimal assistance;Sitting    Lower Body Bathing: Moderate assistance;Sit to/from stand;Cueing for safety;Cueing for sequencing   Upper Body Dressing : Minimal assistance;Sitting   Lower Body Dressing: Moderate assistance;Sit to/from stand   Toilet Transfer: Moderate assistance;BSC   Toileting- Clothing Manipulation and Hygiene: Moderate assistance;Sit to/from stand               Vision Patient Visual Report: No change from baseline        Extremity/Trunk Assessment Upper Extremity Assessment Upper Extremity Assessment: Generalized weakness           Communication Communication Communication: No difficulties   Cognition Arousal/Alertness: Awake/alert Behavior During Therapy: WFL for tasks assessed/performed;Flat affect Overall Cognitive Status: Within Functional Limits for tasks assessed                                                Home Living Family/patient expects to be discharged to:: Private residence Living Arrangements: Other relatives (brother) Available Help at Discharge: Family Type of Home: House Home Access: Stairs to enter     Home Layout: One level     Bathroom Shower/Tub: Chief Strategy Officer: Standard     Home Equipment: None          Prior Functioning/Environment Level of Independence: Independent                 OT Problem List: Decreased strength;Impaired balance (  sitting and/or standing)      OT Treatment/Interventions: Self-care/ADL training;Patient/family education;DME and/or AE instruction;Therapeutic activities    OT Goals(Current goals can be found in the care plan section) Acute Rehab OT Goals Patient Stated Goal: get out of here Time For Goal Achievement: 04/15/20 Potential to Achieve Goals: Good  OT Frequency: Min 2X/week   Barriers to D/C:            Co-evaluation              AM-PAC OT "6 Clicks" Daily Activity     Outcome Measure Help from another person eating meals?: A Little Help from  another person taking care of personal grooming?: A Little Help from another person toileting, which includes using toliet, bedpan, or urinal?: A Lot Help from another person bathing (including washing, rinsing, drying)?: A Little Help from another person to put on and taking off regular upper body clothing?: A Little Help from another person to put on and taking off regular lower body clothing?: A Lot 6 Click Score: 16   End of Session Equipment Utilized During Treatment: Gait belt;Rolling walker Nurse Communication: Mobility status  Activity Tolerance: Patient limited by fatigue Patient left: in chair;with call bell/phone within reach;with chair alarm set  OT Visit Diagnosis: Unsteadiness on feet (R26.81);Muscle weakness (generalized) (M62.81);History of falling (Z91.81)                Time: 9449-6759 OT Time Calculation (min): 16 min Charges:  OT General Charges $OT Visit: 1 Visit OT Evaluation $OT Eval Moderate Complexity: 1 Mod  Lise Auer, OT Acute Rehabilitation Services [phone] Office- [phone]     Shubh Chiara, Karin Golden D 04/01/2020, 4:46 PM

## 2020-04-02 DIAGNOSIS — T50901S Poisoning by unspecified drugs, medicaments and biological substances, accidental (unintentional), sequela: Secondary | ICD-10-CM

## 2020-04-02 LAB — CBC
HCT: 50 % (ref 39.0–52.0)
Hemoglobin: 16.5 g/dL (ref 13.0–17.0)
MCH: 31 pg (ref 26.0–34.0)
MCHC: 33 g/dL (ref 30.0–36.0)
MCV: 94 fL (ref 80.0–100.0)
Platelets: 207 10*3/uL (ref 150–400)
RBC: 5.32 MIL/uL (ref 4.22–5.81)
RDW: 12.9 % (ref 11.5–15.5)
WBC: 8.3 10*3/uL (ref 4.0–10.5)
nRBC: 0 % (ref 0.0–0.2)

## 2020-04-02 LAB — URINE CULTURE: Culture: NO GROWTH

## 2020-04-02 LAB — BASIC METABOLIC PANEL
Anion gap: 9 (ref 5–15)
BUN: 27 mg/dL — ABNORMAL HIGH (ref 6–20)
CO2: 28 mmol/L (ref 22–32)
Calcium: 8.4 mg/dL — ABNORMAL LOW (ref 8.9–10.3)
Chloride: 100 mmol/L (ref 98–111)
Creatinine, Ser: 1.33 mg/dL — ABNORMAL HIGH (ref 0.61–1.24)
GFR calc Af Amer: >60 mL/min (ref >60)
GFR calc non Af Amer: 60 mL/min — ABNORMAL LOW (ref >60)
Glucose, Bld: 126 mg/dL — ABNORMAL HIGH (ref 70–99)
Potassium: 4 mmol/L (ref 3.5–5.1)
Sodium: 137 mmol/L (ref 135–145)

## 2020-04-02 LAB — PROCALCITONIN: Procalcitonin: 6.03 ng/mL

## 2020-04-02 LAB — GLUCOSE, CAPILLARY: Glucose-Capillary: 238 mg/dL — ABNORMAL HIGH (ref 70–99)

## 2020-04-02 NOTE — Discharge Summary (Signed)
Physician Discharge Summary  Luke Harper XTK:[id number] DOB: 01-May-1966 DOA: 03/31/2020  PCP: [facility]  Admit date: 03/31/2020 Discharge date: 04/02/2020  Time spent: 50 minutes  Recommendations for Outpatient Follow-up:  1. Follow-up with Pa, [facility] in 2 weeks.  On follow-up patient will need a basic metabolic profile done to follow-up on electrolytes and renal function.   Discharge Diagnoses:  Principal Problem:   Overdose, accidental or unintentional, initial encounter Active Problems:   Overdose   AKI (acute kidney injury) ([place])   Bipolar 1 disorder ([place])   Substance abuse ([place])   Anxiety   Depression   Chronic hepatitis C without hepatic coma ([place])   Discharge Condition: Stable and improved  Diet recommendation: Heart healthy  Filed Weights   03/31/20 1829  Weight: 85.5 kg    History of present illness:  HPI per Dr.Shahmehdi [name] is a 54 y.o. male with medical history significant of anxiety/depression, bipolar disorder, chronic history of hepatitis C, pancreatitis, history of substance abuse presented with a chief complaint of overdose.  Patient reported he has taken heroin and cocaine. Patient was found lethargic by EMS, Narcan was given.  Patient rate pain mild to moderately lethargic in ED, no evidence of Narcan was given.  Patient is easily arousable upset with his partner, she has given him to drugs last night.  Patient Denies having: Fever, Chills, Cough, SOB, Chest Pain, Abd pain, N/V/D, headache, dizziness, lightheadedness,  Dysuria, Joint pain, rash, open wounds  ED Course:   In ED evaluation patient was found lethargic, hypoxic to 75% on room air.. Currently satting 100% on 4 L of oxygen supplemental oxygen 2 L, Noted for elevated creatinine 1.9, potassium 5.7, leukocytosis 16.6, neutrophil 14, chest x-ray clear UDS positive for amphetamine, opiates, cocaine   SARS-CoV-2 negative, reports  vaccinated  Hospital Course:  1 accidental overdose heroin/cocaine Patient was admitted after accidental overdose of cocaine or heroin.  Status post dose of Narcan in the field and 1 in the ED.  Patient was admitted and monitored closely during the hospitalization.  Patient was initially drowsy but arousable and answering questions appropriately.  UDS done was positive for amphetamines opiates and cocaine.  Psychiatry consulted and patient deemed not a risk to self or others at this time and did not meet inpatient criteria for admission.  Patient was placed on the clonidine detox protocol general hospitalization.  Patient was in stable and improved condition by day of discharge.  Polysubstance abuse cessation was stressed to patient.  Outpatient follow-up.   2.  Acute kidney injury Secondary to prerenal azotemia.  Creatinine was as high as 2.0 during the hospitalization.  Urinalysis with a specific gravity of 1.021, nitrite negative, leukocytes negative, 100 protein.  Patient hydrated with IV fluids.  Renal function improved such that day of discharge creatinine was down to 1.33. Outpatient follow-up with PCP.  3.  SIRS/leukocytosis ruled out Patient met criteria for SIRS secondary to hypoxia, tachypnea, leukocytosis. Chest x-ray done unremarkable.  Urinalysis negative..  Urine cultures negative.  Patient remained afebrile.  Patient was placed on IV Zosyn throughout the hospitalization.  Patient improved clinically.  Leukocytosis trended down and had resolved by day of discharge.  No antibiotics needed on discharge.  Outpatient follow-up with PCP.   4.  History of anxiety/depression/bipolar disorder Patient denied any homicidal suicidal ideation.  Patient noted to have been taking Seroquel, Adderall, Abilify, Prozac, trazodone.  Patient resumed back on Abilify, Prozac and trazodone.  Patient seen in consultation by psychiatry and it is noted that patient was not imminent risk to self or others and  does not meet criteria for inpatient psychiatric admission.  Psychiatry recommended continuation of Abilify Lamictal, trazodone and Prozac.  Lamictal resumed.  Psychiatry recommended weaning patient off opioid and benzos due to recurrent substance abuse.  Social work consulted to facilitate referral to Dr. Silvio Pate at the [facility].  Patient remained in stable condition and patient was discharged home in stable and improved condition.  Outpatient follow-up.    Procedures:  Chest x-ray 03/31/2020  Consultations:  Psychiatry: Dr.Akintayo 04/01/2020  Discharge Exam: Vitals:   04/02/20 0605 04/02/20 0944  BP: 116/78 (!) 118/86  Pulse: 84 85  Resp: 16 16  Temp: 97.6 F (36.4 C) 97.8 F (36.6 C)  SpO2: 99% 92%    General: NAD Cardiovascular: RRR Respiratory: CTAB  Discharge Instructions   Discharge Instructions    Diet - low sodium heart healthy   Complete by: As directed    Increase activity slowly   Complete by: As directed      Allergies as of 04/02/2020   No Known Allergies     Medication List    STOP taking these medications   ibuprofen 200 MG tablet Commonly known as: ADVIL     TAKE these medications   amphetamine-dextroamphetamine 30 MG tablet Commonly known as: ADDERALL Take 1 tablet by mouth 2 (two) times daily.   ARIPiprazole 5 MG tablet Commonly known as: ABILIFY Take 5 mg by mouth daily.   B-complex with vitamin C tablet Take 1 tablet by mouth daily.   clonazePAM 1 MG tablet Commonly known as: KLONOPIN Take 1 mg by mouth 3 (three) times daily as needed for anxiety.   FLUoxetine 40 MG capsule Commonly known as: PROZAC Take 40 mg by mouth daily.   lamoTRIgine 100 MG tablet Commonly known as: LAMICTAL Take 100 mg by mouth daily.   multivitamin with minerals Tabs tablet Take 1 tablet by mouth daily.   naproxen 250 MG tablet Commonly known as: NAPROSYN Take 250 mg by mouth 2 (two) times daily as needed for moderate pain.   traZODone 100  MG tablet Commonly known as: DESYREL Take 100 mg by mouth at bedtime.      No Known Allergies  Follow-up Information    [facility], [facility]. Schedule an appointment as soon as possible for a visit in 2 week(s).   Specialty: Family Medicine Contact information: [address] [place] [address] [phone]                The results of significant diagnostics from this hospitalization (including imaging, microbiology, ancillary and laboratory) are listed below for reference.    Significant Diagnostic Studies: DG Chest Portable 1 View  Result Date: 03/31/2020 CLINICAL DATA:  Dyspnea.  Drug abuse. EXAM: PORTABLE CHEST 1 VIEW COMPARISON:  08/01/2018 FINDINGS: Poor inspiration. Grossly stable normal sized heart and clear lungs. Mild lower thoracic spine degenerative changes. IMPRESSION: No acute abnormality. Electronically Signed   By: Claudie Revering M.D.   On: 03/31/2020 14:13    Microbiology: Recent Results (from the past 240 hour(s))  SARS Coronavirus 2 by RT PCR (hospital order, performed in [facility] hospital lab) Nasopharyngeal Nasopharyngeal Swab     Status: None   Collection Time: 03/31/20  1:38 PM   Specimen: Nasopharyngeal Swab  Result Value Ref Range Status   SARS Coronavirus 2 NEGATIVE NEGATIVE Final    Comment: (  NOTE) SARS-CoV-2 target nucleic acids are NOT DETECTED.  The SARS-CoV-2 RNA is generally detectable in upper and lower respiratory specimens during the acute phase of infection. The lowest concentration of SARS-CoV-2 viral copies this assay can detect is 250 copies / mL. A negative result does not preclude SARS-CoV-2 infection and should not be used as the sole basis for treatment or other patient management decisions.  A negative result may occur with improper specimen collection / handling, submission of specimen other than nasopharyngeal swab, presence of viral mutation(s) within the areas targeted by this assay,  and inadequate number of viral copies (<250 copies / mL). A negative result must be combined with clinical observations, patient history, and epidemiological information.  Fact Sheet for Patients:   StrictlyIdeas.no  Fact Sheet for Healthcare Providers: BankingDealers.co.za  This test is not yet approved or  cleared by the [place] FDA and has been authorized for detection and/or diagnosis of SARS-CoV-2 by FDA under an Emergency Use Authorization (EUA).  This EUA will remain in effect (meaning this test can be used) for the duration of the COVID-19 declaration under Section 564(b)(1) of the Act, 21 U.S.C. section 360bbb-3(b)(1), unless the authorization is terminated or revoked sooner.  Performed at [facility], [place] [address], [place], [place] [address]   Urine culture     Status: None   Collection Time: 03/31/20  4:32 PM   Specimen: Urine, Random  Result Value Ref Range Status   Specimen Description   Final    URINE, RANDOM Performed at [facility] [address] [place], [place] [address]    Special Requests   Final    NONE Performed at [facility], [place] [address], [place], [place] [address]    Culture   Final    NO GROWTH Performed at [facility], [place] [address] [place], [place] [address]    Report Status 04/02/2020 FINAL  Final  Culture, blood (routine x 2)     Status: None (Preliminary result)   Collection Time: 03/31/20  6:45 PM   Specimen: BLOOD  Result Value Ref Range Status   Specimen Description   Final    BLOOD RIGHT ANTECUBITAL Performed at [facility] [address], [place], [place] [address]    Special Requests   Final    BOTTLES DRAWN AEROBIC ONLY Blood Culture adequate volume Performed at [facility] [address], [place], [place] [address]    Culture   Final    NO GROWTH 2  DAYS Performed at [facility] [address], [place], [place] [address]    Report Status PENDING  Incomplete  Culture, blood (routine x 2)     Status: None (Preliminary result)   Collection Time: 03/31/20  6:45 PM   Specimen: BLOOD RIGHT HAND  Result Value Ref Range Status   Specimen Description   Final    BLOOD RIGHT HAND Performed at [facility] [address] [place], [place] [address]    Special Requests   Final    BOTTLES DRAWN AEROBIC AND ANAEROBIC Blood Culture adequate volume Performed at [facility] [address], [place], [place] [address]    Culture   Final    NO GROWTH 2 DAYS Performed at [facility] [address] [place], [place] [address]    Report Status PENDING  Incomplete     Labs: Basic Metabolic Panel: Recent Labs  Lab 03/31/20 1332 03/31/20 1634 04/01/20 0617 04/02/20 0659  NA 140  --  140 137  K 5.7*  --  4.5 4.0  CL 103  --  99 100  CO2 24  --  27 28  GLUCOSE 106*  --  101* 126*  BUN 34*  --  41* 27*  CREATININE 1.91*  --  2.01* 1.33*  CALCIUM 7.9* 8.3* 8.4* 8.4*  MG  --  2.7*  --   --   PHOS  --  7.1*  --   --    Liver Function Tests: No results for input(s): AST, ALT, ALKPHOS, BILITOT, PROT, ALBUMIN in the last 168 hours. No results for input(s): LIPASE, AMYLASE in the last 168 hours. No results for input(s): AMMONIA in the last 168 hours. CBC: Recent Labs  Lab 03/31/20 1332 04/01/20 0617 04/02/20 0903  WBC 16.6* 11.9* 8.3  NEUTROABS 14.0*  --   --   HGB 17.6* 18.5* 16.5  HCT 52.5* 55.4* 50.0  MCV 94.9 93.6 94.0  PLT 269 229 207   Cardiac Enzymes: No results for input(s): CKTOTAL, CKMB, CKMBINDEX, TROPONINI in the last 168 hours. BNP: BNP (last 3 results) Recent Labs    03/31/20 1332 03/31/20 1601  BNP 101.6* 107.2*    ProBNP (last 3 results) No results for input(s): PROBNP in the last 8760 hours.  CBG: Recent Labs  Lab 03/31/20 1314 04/02/20 0707   GLUCAP 120* 238*       Signed:  Irine Seal MD.  [facility] 04/02/2020, 11:44 AM

## 2020-04-02 NOTE — TOC Initial Note (Signed)
Transition of Care [facility]) - Initial/Assessment Note    Patient Details  Name: Luke Harper MRN: [id number] Date of Birth: 10/31/1965  Transition of Care (TOC) CM/SW Contact:    Armanda Heritage, RN Phone Number: 04/02/2020, 12:02 PM  Clinical Narrative:                 Information for the [facility] placed on AVS. Center is closed on the weekend and patient will need to call for follow-up during the week.  Expected Discharge Plan: Home/Self Care Barriers to Discharge: No Barriers Identified   Patient Goals and CMS Choice Patient states their goals for this hospitalization and ongoing recovery are:: to go home      Expected Discharge Plan and Services Expected Discharge Plan: Home/Self Care   Discharge Planning Services: CM Consult     Expected Discharge Date: 04/02/20               DME Arranged: N/A DME Agency: NA       HH Arranged: NA HH Agency: NA        Prior Living Arrangements/Services     Patient language and need for interpreter reviewed:: Yes Do you feel safe going back to the place where you live?: Yes            Criminal Activity/Legal Involvement Pertinent to Current Situation/Hospitalization: No - Comment as needed  Activities of Daily Living Home Assistive Devices/Equipment: Other (Comment) (pt too groggy to answer this, keeps falling asleep) ADL Screening (condition at time of admission) Patient's cognitive ability adequate to safely complete daily activities?: No Is the patient deaf or have difficulty hearing?: No Does the patient have difficulty seeing, even when wearing glasses/contacts?: No Does the patient have difficulty concentrating, remembering, or making decisions?: Yes Patient able to express need for assistance with ADLs?: Yes Does the patient have difficulty dressing or bathing?: No Independently performs ADLs?: Yes (appropriate for developmental age) Does the patient have difficulty walking or climbing stairs?:  Yes Weakness of Legs: Both Weakness of Arms/Hands: Both  Permission Sought/Granted                  Emotional Assessment           Psych Involvement: No (comment)  Admission diagnosis:  Overdose [T50.901A] AKI (acute kidney injury) (HCC) [N17.9] Accidental drug overdose, initial encounter [T50.901A] Patient Active Problem List   Diagnosis Date Noted  . Overdose 03/31/2020  . Overdose, accidental or unintentional, initial encounter 03/31/2020  . AKI (acute kidney injury) (HCC) 03/31/2020  . Bipolar 1 disorder (HCC) 03/31/2020  . Substance abuse (HCC) 03/31/2020  . Anxiety 03/31/2020  . Depression 03/31/2020  . Chronic hepatitis C without hepatic coma (HCC) 03/31/2020   PCP:  [facility] Pharmacy:   [facility] [address]), [place] - [address] [place] ([place]) [place] [address] Phone: [phone] Fax: [phone]  [facility] - [place], [place] - [address] [place] [address] Phone: [phone] Fax: [phone]     Social Determinants of Health (SDOH) Interventions    Readmission Risk Interventions No flowsheet data found.

## 2020-04-06 LAB — CULTURE, BLOOD (ROUTINE X 2)
Culture: NO GROWTH
Culture: NO GROWTH
Special Requests: ADEQUATE
Special Requests: ADEQUATE

## 2020-08-22 DIAGNOSIS — R059 Cough, unspecified: Secondary | ICD-10-CM | POA: Diagnosis not present

## 2020-08-22 DIAGNOSIS — Z03818 Encounter for observation for suspected exposure to other biological agents ruled out: Secondary | ICD-10-CM | POA: Diagnosis not present

## 2021-03-18 ENCOUNTER — Emergency Department (HOSPITAL_COMMUNITY)
Admission: EM | Admit: 2021-03-18 | Discharge: 2021-03-18 | Disposition: A | Discharge status: Home / Self Care | Payer: BC Managed Care – PPO | Attending: Emergency Medicine | Admitting: Emergency Medicine

## 2021-03-18 ENCOUNTER — Other Ambulatory Visit: Payer: Self-pay

## 2021-03-18 DIAGNOSIS — L02413 Cutaneous abscess of right upper limb: Secondary | ICD-10-CM | POA: Diagnosis not present

## 2021-03-18 DIAGNOSIS — Z5321 Procedure and treatment not carried out due to patient leaving prior to being seen by health care provider: Secondary | ICD-10-CM | POA: Diagnosis not present

## 2021-03-18 NOTE — ED Triage Notes (Signed)
Pt came in with c/o  abscess to R arm. States it started approx six days ago.

## 2022-08-29 ENCOUNTER — Other Ambulatory Visit (HOSPITAL_COMMUNITY): Payer: Self-pay | Admitting: Sports Medicine

## 2022-08-29 ENCOUNTER — Ambulatory Visit (HOSPITAL_COMMUNITY)
Admission: RE | Admit: 2022-08-29 | Discharge: 2022-08-29 | Disposition: A | Discharge status: Home / Self Care | Payer: Commercial Managed Care - HMO | Source: Ambulatory Visit | Attending: Vascular Surgery | Admitting: Vascular Surgery

## 2022-08-29 DIAGNOSIS — I739 Peripheral vascular disease, unspecified: Secondary | ICD-10-CM | POA: Insufficient documentation

## 2022-09-13 ENCOUNTER — Telehealth: Payer: Self-pay

## 2022-09-13 NOTE — Telephone Encounter (Signed)
Pt's fiance, Amy Wynetta Emery, called stating that his doctor had not received the results of an US done approx 2 weeks ago.  ABI results sent via electronic fax to [facility] for the ordering provider, Dr. Inez Catalina. Called pt, two identifiers used. Updated pt. Confirmed understanding.

## 2023-01-30 ENCOUNTER — Emergency Department (HOSPITAL_COMMUNITY)
Admission: EM | Admit: 2023-01-30 | Discharge: 2023-01-30 | Disposition: A | Discharge status: Home / Self Care | Payer: Medicaid Other | Attending: Emergency Medicine | Admitting: Emergency Medicine

## 2023-01-30 ENCOUNTER — Other Ambulatory Visit: Payer: Self-pay

## 2023-01-30 ENCOUNTER — Emergency Department (HOSPITAL_COMMUNITY): Payer: Medicaid Other

## 2023-01-30 DIAGNOSIS — R1030 Lower abdominal pain, unspecified: Secondary | ICD-10-CM | POA: Diagnosis not present

## 2023-01-30 DIAGNOSIS — R350 Frequency of micturition: Secondary | ICD-10-CM | POA: Diagnosis present

## 2023-01-30 DIAGNOSIS — N200 Calculus of kidney: Secondary | ICD-10-CM

## 2023-01-30 LAB — URINALYSIS, ROUTINE W REFLEX MICROSCOPIC
Bilirubin Urine: NEGATIVE
Glucose, UA: NEGATIVE mg/dL
Ketones, ur: NEGATIVE mg/dL
Leukocytes,Ua: NEGATIVE
Nitrite: NEGATIVE
Protein, ur: NEGATIVE mg/dL
RBC / HPF: >50 RBC/hpf (ref 0–5)
Specific Gravity, Urine: 1.01 (ref 1.005–1.030)
pH: 7 (ref 5.0–8.0)

## 2023-01-30 NOTE — ED Triage Notes (Signed)
Pt reports "burning sensation" and "unable to pee" c/o lower abd, lower back and testicular pain.

## 2023-01-30 NOTE — ED Notes (Signed)
RN informed that patient walked out without receiving AVS paper work that he stated, "My ride is here I have to go."

## 2023-01-30 NOTE — Discharge Instructions (Addendum)
Use Tylenol and Motrin as directed for pain °

## 2023-01-30 NOTE — ED Provider Notes (Signed)
[facility] EM[facility]Y DE[facility]MENT AT [facility] Provider Note   CSN: [id number] Arrival date & time: 01/30/23  8657     History  Chief Complaint  Patient presents with   Abdominal Pain   Urinary Retention    Luke Harper is a 57 y.o. male.  57 year old male presents with urinary frequency x 1 day.  Denies any fever or flank pain.  Denies any dysuria.  No prior history of prostate issues.  States he is urinating small amounts.  Denies any hematuria.  No recent medication changes.  No treatment use prior to arrival       Home Medications Prior to Admission medications   Medication Sig Start Date End Date Taking? Authorizing Provider  amphetamine-dextroamphetamine (ADDERALL) 30 MG tablet Take 1 tablet by mouth 2 (two) times daily. Patient not taking: Reported on 03/18/2021 03/27/20   [provider]  ARIPiprazole (ABILIFY) 5 MG tablet Take 5 mg by mouth daily.  Patient not taking: Reported on 03/18/2021 03/27/20   [provider]  B Complex-C (B-COMPLEX WITH VITAMIN C) tablet Take 1 tablet by mouth daily. Patient not taking: Reported on 03/18/2021    [provider]  clonazePAM (KLONOPIN) 1 MG tablet Take 1 mg by mouth 3 (three) times daily as needed for anxiety.  Patient not taking: Reported on 03/18/2021 10/15/13   [provider]  FLUoxetine (PROZAC) 40 MG capsule Take 40 mg by mouth daily. Patient not taking: Reported on 03/18/2021 02/15/20   [provider]  lamoTRIgine (LAMICTAL) 100 MG tablet Take 100 mg by mouth daily. Patient not taking: Reported on 03/18/2021 02/15/20   [provider]  Multiple Vitamin (MULTIVITAMIN WITH MINERALS) TABS Take 1 tablet by mouth daily. Patient not taking: Reported on 03/18/2021    [provider]  naproxen (NAPROSYN) 250 MG tablet Take 250 mg by mouth 2 (two) times daily as needed for moderate pain. Patient not taking: Reported on 03/18/2021    [provider]  traZODone  (DESYREL) 100 MG tablet Take 100 mg by mouth at bedtime. Patient not taking: Reported on 03/18/2021 03/27/20   [provider]      Allergies    Patient has no known allergies.    Review of Systems   Review of Systems  All other systems reviewed and are negative.   Physical Exam Updated Vital Signs BP (!) 143/89   Pulse 75   Temp 97.6 F (36.4 C) (Oral)   Resp 18   Ht 1.753 m (5\' 9" )   Wt 79.4 kg   SpO2 94%   BMI 25.84 kg/m  Physical Exam Vitals and nursing note reviewed.  Constitutional:      General: He is not in acute distress.    Appearance: Normal appearance. He is well-developed. He is not toxic-appearing.  HENT:     Head: Normocephalic and atraumatic.  Eyes:     General: Lids are normal.     Conjunctiva/sclera: Conjunctivae normal.     Pupils: Pupils are equal, round, and reactive to light.  Neck:     Thyroid: No thyroid mass.     Trachea: No tracheal deviation.  Cardiovascular:     Rate and Rhythm: Normal rate and regular rhythm.     Heart sounds: Normal heart sounds. No murmur heard.    No gallop.  Pulmonary:     Effort: Pulmonary effort is normal. No respiratory distress.     Breath sounds: Normal breath sounds. No stridor. No decreased breath sounds,  wheezing, rhonchi or rales.  Abdominal:     General: There is no distension.     Palpations: Abdomen is soft.     Tenderness: There is abdominal tenderness in the suprapubic area. There is no rebound.    Musculoskeletal:        General: No tenderness. Normal range of motion.     Cervical back: Normal range of motion and neck supple.  Skin:    General: Skin is warm and dry.     Findings: No abrasion or rash.  Neurological:     Mental Status: He is alert and oriented to person, place, and time. Mental status is at baseline.     GCS: GCS eye subscore is 4. GCS verbal subscore is 5. GCS motor subscore is 6.     Cranial Nerves: No cranial nerve deficit.     Sensory: No sensory deficit.     Motor:  Motor function is intact.  Psychiatric:        Attention and Perception: Attention normal.        Speech: Speech normal.        Behavior: Behavior normal.     ED Results / Procedures / Treatments   Labs (all labs ordered are listed, but only abnormal results are displayed) Labs Reviewed  URINALYSIS, ROUTINE W REFLEX MICROSCOPIC    EKG None  Radiology No results found.  Procedures Procedures    Medications Ordered in ED Medications - No data to display  ED Course/ Medical Decision Making/ A&P                             Medical Decision Making Amount and/or Complexity of Data Reviewed Labs: ordered. Radiology: ordered.   Patient's pain has been controlled here.  Concern for possible UTI urinalysis shows hematuria as well as increased white blood cells.  Concern for possible kidney stone and had a CT renal stone which does show a 4 mm stone in his bladder with some residual hydronephrosis on the left side.  Patient feels better at this time.  He is able to urinate appropriately.  Will discharge home and give urology referral        Final Clinical Impression(s) / ED Diagnoses Final diagnoses:  None    Rx / DC Orders ED Discharge Orders     None         Lorre Nick, MD 01/30/23 1149

## 2023-03-03 DIAGNOSIS — M7542 Impingement syndrome of left shoulder: Secondary | ICD-10-CM | POA: Diagnosis not present

## 2023-09-18 DIAGNOSIS — M2550 Pain in unspecified joint: Secondary | ICD-10-CM | POA: Diagnosis not present

## 2023-09-18 DIAGNOSIS — M6281 Muscle weakness (generalized): Secondary | ICD-10-CM | POA: Diagnosis not present

## 2023-09-18 DIAGNOSIS — Z6829 Body mass index (BMI) 29.0-29.9, adult: Secondary | ICD-10-CM | POA: Diagnosis not present

## 2023-09-18 DIAGNOSIS — Z87898 Personal history of other specified conditions: Secondary | ICD-10-CM | POA: Diagnosis not present

## 2023-09-18 DIAGNOSIS — E291 Testicular hypofunction: Secondary | ICD-10-CM | POA: Diagnosis not present

## 2023-09-18 DIAGNOSIS — F329 Major depressive disorder, single episode, unspecified: Secondary | ICD-10-CM | POA: Diagnosis not present

## 2023-09-18 DIAGNOSIS — F17200 Nicotine dependence, unspecified, uncomplicated: Secondary | ICD-10-CM | POA: Diagnosis not present

## 2023-09-18 DIAGNOSIS — F1911 Other psychoactive substance abuse, in remission: Secondary | ICD-10-CM | POA: Diagnosis not present

## 2023-09-23 DIAGNOSIS — E291 Testicular hypofunction: Secondary | ICD-10-CM | POA: Diagnosis not present

## 2023-09-30 DIAGNOSIS — J189 Pneumonia, unspecified organism: Secondary | ICD-10-CM | POA: Diagnosis not present

## 2023-09-30 DIAGNOSIS — J101 Influenza due to other identified influenza virus with other respiratory manifestations: Secondary | ICD-10-CM | POA: Diagnosis not present

## 2023-09-30 DIAGNOSIS — J069 Acute upper respiratory infection, unspecified: Secondary | ICD-10-CM | POA: Diagnosis not present

## 2023-10-09 DIAGNOSIS — E291 Testicular hypofunction: Secondary | ICD-10-CM | POA: Diagnosis not present

## 2023-10-17 DIAGNOSIS — E291 Testicular hypofunction: Secondary | ICD-10-CM | POA: Diagnosis not present

## 2023-11-01 DIAGNOSIS — J0140 Acute pansinusitis, unspecified: Secondary | ICD-10-CM | POA: Diagnosis not present

## 2023-11-04 DIAGNOSIS — E291 Testicular hypofunction: Secondary | ICD-10-CM | POA: Diagnosis not present

## 2023-11-18 DIAGNOSIS — E291 Testicular hypofunction: Secondary | ICD-10-CM | POA: Diagnosis not present

## 2024-01-07 DIAGNOSIS — Z6829 Body mass index (BMI) 29.0-29.9, adult: Secondary | ICD-10-CM | POA: Diagnosis not present

## 2024-01-07 DIAGNOSIS — F329 Major depressive disorder, single episode, unspecified: Secondary | ICD-10-CM | POA: Diagnosis not present

## 2024-01-07 DIAGNOSIS — E291 Testicular hypofunction: Secondary | ICD-10-CM | POA: Diagnosis not present

## 2024-01-07 DIAGNOSIS — M2550 Pain in unspecified joint: Secondary | ICD-10-CM | POA: Diagnosis not present

## 2024-01-22 DIAGNOSIS — Z79890 Hormone replacement therapy (postmenopausal): Secondary | ICD-10-CM | POA: Diagnosis not present

## 2024-01-22 DIAGNOSIS — F319 Bipolar disorder, unspecified: Secondary | ICD-10-CM | POA: Diagnosis not present

## 2024-01-22 DIAGNOSIS — F17210 Nicotine dependence, cigarettes, uncomplicated: Secondary | ICD-10-CM | POA: Diagnosis not present

## 2024-01-28 DIAGNOSIS — F1120 Opioid dependence, uncomplicated: Secondary | ICD-10-CM | POA: Diagnosis not present

## 2024-01-28 DIAGNOSIS — F1020 Alcohol dependence, uncomplicated: Secondary | ICD-10-CM | POA: Diagnosis not present

## 2024-03-02 DIAGNOSIS — F17210 Nicotine dependence, cigarettes, uncomplicated: Secondary | ICD-10-CM | POA: Diagnosis not present

## 2024-03-02 DIAGNOSIS — F319 Bipolar disorder, unspecified: Secondary | ICD-10-CM | POA: Diagnosis not present

## 2024-03-02 DIAGNOSIS — Z6829 Body mass index (BMI) 29.0-29.9, adult: Secondary | ICD-10-CM | POA: Diagnosis not present

## 2024-03-11 DIAGNOSIS — F1020 Alcohol dependence, uncomplicated: Secondary | ICD-10-CM | POA: Diagnosis not present

## 2024-03-11 DIAGNOSIS — F1120 Opioid dependence, uncomplicated: Secondary | ICD-10-CM | POA: Diagnosis not present

## 2024-03-16 DIAGNOSIS — F1020 Alcohol dependence, uncomplicated: Secondary | ICD-10-CM | POA: Diagnosis not present

## 2024-03-16 DIAGNOSIS — F1120 Opioid dependence, uncomplicated: Secondary | ICD-10-CM | POA: Diagnosis not present

## 2024-03-19 DIAGNOSIS — F1020 Alcohol dependence, uncomplicated: Secondary | ICD-10-CM | POA: Diagnosis not present

## 2024-03-19 DIAGNOSIS — F1120 Opioid dependence, uncomplicated: Secondary | ICD-10-CM | POA: Diagnosis not present

## 2024-03-23 DIAGNOSIS — F1120 Opioid dependence, uncomplicated: Secondary | ICD-10-CM | POA: Diagnosis not present

## 2024-03-23 DIAGNOSIS — F1020 Alcohol dependence, uncomplicated: Secondary | ICD-10-CM | POA: Diagnosis not present

## 2024-04-06 DIAGNOSIS — F1120 Opioid dependence, uncomplicated: Secondary | ICD-10-CM | POA: Diagnosis not present

## 2024-04-06 DIAGNOSIS — F1020 Alcohol dependence, uncomplicated: Secondary | ICD-10-CM | POA: Diagnosis not present

## 2024-04-12 DIAGNOSIS — Z6828 Body mass index (BMI) 28.0-28.9, adult: Secondary | ICD-10-CM | POA: Diagnosis not present

## 2024-04-12 DIAGNOSIS — F319 Bipolar disorder, unspecified: Secondary | ICD-10-CM | POA: Diagnosis not present

## 2024-04-12 DIAGNOSIS — B1920 Unspecified viral hepatitis C without hepatic coma: Secondary | ICD-10-CM | POA: Diagnosis not present

## 2024-04-12 DIAGNOSIS — F17210 Nicotine dependence, cigarettes, uncomplicated: Secondary | ICD-10-CM | POA: Diagnosis not present

## 2024-05-04 DIAGNOSIS — F1020 Alcohol dependence, uncomplicated: Secondary | ICD-10-CM | POA: Diagnosis not present

## 2024-05-05 DIAGNOSIS — F1120 Opioid dependence, uncomplicated: Secondary | ICD-10-CM | POA: Diagnosis not present

## 2024-05-05 DIAGNOSIS — B182 Chronic viral hepatitis C: Secondary | ICD-10-CM | POA: Diagnosis not present

## 2024-05-05 DIAGNOSIS — F1020 Alcohol dependence, uncomplicated: Secondary | ICD-10-CM | POA: Diagnosis not present

## 2024-05-20 DIAGNOSIS — F1020 Alcohol dependence, uncomplicated: Secondary | ICD-10-CM | POA: Diagnosis not present

## 2024-05-20 DIAGNOSIS — F1120 Opioid dependence, uncomplicated: Secondary | ICD-10-CM | POA: Diagnosis not present

## 2024-05-25 DIAGNOSIS — Z1211 Encounter for screening for malignant neoplasm of colon: Secondary | ICD-10-CM | POA: Diagnosis not present

## 2024-05-25 DIAGNOSIS — K5730 Diverticulosis of large intestine without perforation or abscess without bleeding: Secondary | ICD-10-CM | POA: Diagnosis not present

## 2024-05-25 DIAGNOSIS — D124 Benign neoplasm of descending colon: Secondary | ICD-10-CM | POA: Diagnosis not present

## 2024-05-25 DIAGNOSIS — D123 Benign neoplasm of transverse colon: Secondary | ICD-10-CM | POA: Diagnosis not present

## 2024-05-25 DIAGNOSIS — D122 Benign neoplasm of ascending colon: Secondary | ICD-10-CM | POA: Diagnosis not present

## 2024-05-25 DIAGNOSIS — D125 Benign neoplasm of sigmoid colon: Secondary | ICD-10-CM | POA: Diagnosis not present

## 2024-06-01 DIAGNOSIS — F1020 Alcohol dependence, uncomplicated: Secondary | ICD-10-CM | POA: Diagnosis not present

## 2024-06-01 DIAGNOSIS — F1120 Opioid dependence, uncomplicated: Secondary | ICD-10-CM | POA: Diagnosis not present

## 2024-06-03 DIAGNOSIS — F1120 Opioid dependence, uncomplicated: Secondary | ICD-10-CM | POA: Diagnosis not present

## 2024-06-03 DIAGNOSIS — B182 Chronic viral hepatitis C: Secondary | ICD-10-CM | POA: Diagnosis not present

## 2024-06-03 DIAGNOSIS — K8020 Calculus of gallbladder without cholecystitis without obstruction: Secondary | ICD-10-CM | POA: Diagnosis not present

## 2024-06-03 DIAGNOSIS — F1020 Alcohol dependence, uncomplicated: Secondary | ICD-10-CM | POA: Diagnosis not present

## 2024-06-21 DIAGNOSIS — B1920 Unspecified viral hepatitis C without hepatic coma: Secondary | ICD-10-CM | POA: Diagnosis not present

## 2024-07-07 DIAGNOSIS — F1020 Alcohol dependence, uncomplicated: Secondary | ICD-10-CM | POA: Diagnosis not present

## 2024-07-07 DIAGNOSIS — F1120 Opioid dependence, uncomplicated: Secondary | ICD-10-CM | POA: Diagnosis not present
